# Patient Record
Sex: Female | Born: 1965 | Race: White | Hispanic: No | Marital: Married | Smoking: Never smoker
Health system: Southern US, Community
[De-identification: ages and names within clinical notes are randomized; demographics above are authoritative.]

## PROBLEM LIST (undated history)

## (undated) DIAGNOSIS — I10 Essential (primary) hypertension: Secondary | ICD-10-CM

## (undated) DIAGNOSIS — R600 Localized edema: Secondary | ICD-10-CM

## (undated) DIAGNOSIS — F32A Depression, unspecified: Secondary | ICD-10-CM

## (undated) DIAGNOSIS — K219 Gastro-esophageal reflux disease without esophagitis: Secondary | ICD-10-CM

## (undated) DIAGNOSIS — E119 Type 2 diabetes mellitus without complications: Secondary | ICD-10-CM

## (undated) DIAGNOSIS — F329 Major depressive disorder, single episode, unspecified: Secondary | ICD-10-CM

## (undated) DIAGNOSIS — L680 Hirsutism: Secondary | ICD-10-CM

## (undated) DIAGNOSIS — E785 Hyperlipidemia, unspecified: Secondary | ICD-10-CM

---

## 2006-08-10 ENCOUNTER — Ambulatory Visit: Payer: Self-pay

## 2007-08-15 ENCOUNTER — Ambulatory Visit: Payer: Self-pay

## 2009-03-03 ENCOUNTER — Ambulatory Visit: Payer: Self-pay

## 2009-03-06 ENCOUNTER — Ambulatory Visit: Payer: Self-pay

## 2010-02-25 ENCOUNTER — Ambulatory Visit: Payer: Self-pay

## 2011-04-13 ENCOUNTER — Ambulatory Visit: Payer: Self-pay | Admitting: Obstetrics and Gynecology

## 2012-04-13 ENCOUNTER — Ambulatory Visit: Payer: Self-pay | Admitting: Obstetrics and Gynecology

## 2013-10-05 ENCOUNTER — Ambulatory Visit: Payer: Self-pay | Admitting: Obstetrics and Gynecology

## 2013-10-12 ENCOUNTER — Ambulatory Visit: Payer: Self-pay | Admitting: Obstetrics and Gynecology

## 2015-10-13 ENCOUNTER — Other Ambulatory Visit: Payer: Self-pay | Admitting: Obstetrics and Gynecology

## 2015-10-13 DIAGNOSIS — Z1231 Encounter for screening mammogram for malignant neoplasm of breast: Secondary | ICD-10-CM

## 2015-11-11 ENCOUNTER — Other Ambulatory Visit: Payer: Self-pay | Admitting: Obstetrics and Gynecology

## 2015-11-11 ENCOUNTER — Ambulatory Visit
Admission: RE | Admit: 2015-11-11 | Discharge: 2015-11-11 | Disposition: A | Discharge status: Home / Self Care | Payer: Managed Care, Other (non HMO) | Source: Ambulatory Visit | Attending: Obstetrics and Gynecology | Admitting: Obstetrics and Gynecology

## 2015-11-11 DIAGNOSIS — Z1231 Encounter for screening mammogram for malignant neoplasm of breast: Secondary | ICD-10-CM | POA: Diagnosis not present

## 2015-11-11 DIAGNOSIS — R928 Other abnormal and inconclusive findings on diagnostic imaging of breast: Secondary | ICD-10-CM | POA: Insufficient documentation

## 2015-11-11 DIAGNOSIS — R921 Mammographic calcification found on diagnostic imaging of breast: Secondary | ICD-10-CM

## 2015-11-20 ENCOUNTER — Other Ambulatory Visit: Payer: Self-pay | Admitting: Obstetrics and Gynecology

## 2015-11-20 ENCOUNTER — Ambulatory Visit
Admission: RE | Admit: 2015-11-20 | Discharge: 2015-11-20 | Disposition: A | Discharge status: Home / Self Care | Payer: Managed Care, Other (non HMO) | Source: Ambulatory Visit | Attending: Obstetrics and Gynecology | Admitting: Obstetrics and Gynecology

## 2015-11-20 DIAGNOSIS — R921 Mammographic calcification found on diagnostic imaging of breast: Secondary | ICD-10-CM

## 2015-11-20 DIAGNOSIS — R928 Other abnormal and inconclusive findings on diagnostic imaging of breast: Secondary | ICD-10-CM | POA: Diagnosis present

## 2015-11-26 ENCOUNTER — Ambulatory Visit
Admission: RE | Admit: 2015-11-26 | Discharge: 2015-11-26 | Disposition: A | Discharge status: Home / Self Care | Payer: Managed Care, Other (non HMO) | Source: Ambulatory Visit | Attending: Obstetrics and Gynecology | Admitting: Obstetrics and Gynecology

## 2015-11-26 DIAGNOSIS — R928 Other abnormal and inconclusive findings on diagnostic imaging of breast: Secondary | ICD-10-CM | POA: Insufficient documentation

## 2015-11-26 DIAGNOSIS — R921 Mammographic calcification found on diagnostic imaging of breast: Secondary | ICD-10-CM | POA: Diagnosis not present

## 2015-11-26 HISTORY — PX: BREAST BIOPSY: SHX20

## 2015-11-27 LAB — SURGICAL PATHOLOGY

## 2016-01-07 ENCOUNTER — Other Ambulatory Visit: Payer: Self-pay | Admitting: Surgery

## 2016-01-07 DIAGNOSIS — N6099 Unspecified benign mammary dysplasia of unspecified breast: Secondary | ICD-10-CM

## 2016-01-19 ENCOUNTER — Encounter
Admission: RE | Admit: 2016-01-19 | Discharge: 2016-01-19 | Disposition: A | Discharge status: Home / Self Care | Payer: Managed Care, Other (non HMO) | Source: Ambulatory Visit | Attending: Surgery | Admitting: Surgery

## 2016-01-19 HISTORY — DX: Gastro-esophageal reflux disease without esophagitis: K21.9

## 2016-01-19 HISTORY — DX: Major depressive disorder, single episode, unspecified: F32.9

## 2016-01-19 HISTORY — DX: Depression, unspecified: F32.A

## 2016-01-19 NOTE — Patient Instructions (Signed)
  Your procedure is scheduled on: 01-27-16 [facility] 9 AM PER PT   Remember: Instructions that are not followed completely may result in serious medical risk, up to and including death, or upon the discretion of your surgeon and anesthesiologist your surgery may need to be rescheduled.    _x___ 1. Do not eat food or drink liquids after midnight. No gum chewing or hard candies.     __x__ 2. No Alcohol for 24 hours before or after surgery.   __x__3. No Smoking for 24 prior to surgery.   ____  4. Bring all medications with you on the day of surgery if instructed.    __x__ 5. Notify your doctor if there is any change in your medical condition     (cold, fever, infections).     Do not wear jewelry, make-up, hairpins, clips or nail polish.  Do not wear lotions, powders, or perfumes. You may wear deodorant.  Do not shave 48 hours prior to surgery. Men may shave face and neck.  Do not bring valuables to the hospital.    [facility] is not responsible for any belongings or valuables.               Contacts, dentures or bridgework may not be worn into surgery.  Leave your suitcase in the car. After surgery it may be brought to your room.  For patients admitted to the hospital, discharge time is determined by your treatment team.   Patients discharged the day of surgery will not be allowed to drive home.  You will need someone to drive you home and stay with you the night of your procedure.    Please read over the following fact sheets that you were given:   [facility] Preparing for Surgery and or MRSA Information   ____ Take these medicines the morning of surgery with A SIP OF WATER:    1. NONE  2.  3.  4.  5.  6.  ____Fleets enema or Magnesium Citrate as directed.   ____ Use CHG Soap or sage wipes as directed on instruction sheet   ____ Use inhalers on the day of surgery and bring to hospital day of surgery  ____ Stop metformin 2 days prior to  surgery    ____ Take 1/2 of usual insulin dose the night before surgery and none on the morning of  surgery.   ____ Stop Aspirin, Coumadin, Pllavix ,Eliquis, Effient, or Pradaxa  x__ Stop Anti-inflammatories such as Advil, Aleve, Ibuprofen, Motrin, Naproxen,          Naprosyn, Goodies powders or aspirin products NOW-Ok to take Tylenol.   ____ Stop supplements until after surgery.    ____ Bring C-Pap to the hospital.

## 2016-01-26 ENCOUNTER — Encounter: Payer: Self-pay | Admitting: *Deleted

## 2016-01-27 ENCOUNTER — Ambulatory Visit: Payer: Managed Care, Other (non HMO) | Admitting: Anesthesiology

## 2016-01-27 ENCOUNTER — Ambulatory Visit
Admission: RE | Admit: 2016-01-27 | Discharge: 2016-01-27 | Disposition: A | Discharge status: Home / Self Care | Payer: Managed Care, Other (non HMO) | Source: Ambulatory Visit | Attending: Surgery | Admitting: Surgery

## 2016-01-27 ENCOUNTER — Encounter: Payer: Self-pay | Admitting: *Deleted

## 2016-01-27 ENCOUNTER — Encounter
Admission: RE | Disposition: A | Discharge status: Home / Self Care | Payer: Self-pay | Source: Ambulatory Visit | Attending: Surgery

## 2016-01-27 DIAGNOSIS — K219 Gastro-esophageal reflux disease without esophagitis: Secondary | ICD-10-CM | POA: Insufficient documentation

## 2016-01-27 DIAGNOSIS — F329 Major depressive disorder, single episode, unspecified: Secondary | ICD-10-CM | POA: Diagnosis not present

## 2016-01-27 DIAGNOSIS — N6011 Diffuse cystic mastopathy of right breast: Secondary | ICD-10-CM | POA: Diagnosis not present

## 2016-01-27 DIAGNOSIS — N6021 Fibroadenosis of right breast: Secondary | ICD-10-CM | POA: Insufficient documentation

## 2016-01-27 DIAGNOSIS — N6091 Unspecified benign mammary dysplasia of right breast: Secondary | ICD-10-CM | POA: Diagnosis present

## 2016-01-27 DIAGNOSIS — Z91018 Allergy to other foods: Secondary | ICD-10-CM | POA: Insufficient documentation

## 2016-01-27 DIAGNOSIS — Z803 Family history of malignant neoplasm of breast: Secondary | ICD-10-CM | POA: Insufficient documentation

## 2016-01-27 DIAGNOSIS — I10 Essential (primary) hypertension: Secondary | ICD-10-CM | POA: Insufficient documentation

## 2016-01-27 DIAGNOSIS — N6099 Unspecified benign mammary dysplasia of unspecified breast: Secondary | ICD-10-CM

## 2016-01-27 DIAGNOSIS — N6081 Other benign mammary dysplasias of right breast: Secondary | ICD-10-CM | POA: Insufficient documentation

## 2016-01-27 HISTORY — DX: Localized edema: R60.0

## 2016-01-27 HISTORY — PX: BREAST LUMPECTOMY WITH NEEDLE LOCALIZATION: SHX5759

## 2016-01-27 HISTORY — PX: BREAST EXCISIONAL BIOPSY: SUR124

## 2016-01-27 LAB — POCT PREGNANCY, URINE: Preg Test, Ur: NEGATIVE

## 2016-01-27 SURGERY — BREAST LUMPECTOMY WITH NEEDLE LOCALIZATION
Anesthesia: General | Laterality: Right | Wound class: Clean

## 2016-01-27 MED ORDER — LACTATED RINGERS IV SOLN
INTRAVENOUS | Status: DC
  Administered 2016-01-27 (×2): via INTRAVENOUS

## 2016-01-27 MED ORDER — EPINEPHRINE PF 1 MG/ML IJ SOLN
INTRAMUSCULAR | Status: AC
  Filled 2016-01-27: qty 1

## 2016-01-27 MED ORDER — LIDOCAINE HCL (CARDIAC) 20 MG/ML IV SOLN
INTRAVENOUS | Status: DC | PRN
  Administered 2016-01-27: 100 mg via INTRAVENOUS

## 2016-01-27 MED ORDER — HYDROCODONE-ACETAMINOPHEN 5-325 MG PO TABS: 1.0000 | ORAL_TABLET | ORAL | 0 refills | Status: DC | PRN

## 2016-01-27 MED ORDER — FENTANYL CITRATE (PF) 100 MCG/2ML IJ SOLN
INTRAMUSCULAR | Status: DC | PRN
  Administered 2016-01-27 (×3): 50 ug via INTRAVENOUS

## 2016-01-27 MED ORDER — PHENYLEPHRINE HCL 10 MG/ML IJ SOLN
INTRAMUSCULAR | Status: DC | PRN
  Administered 2016-01-27: 100 ug via INTRAVENOUS

## 2016-01-27 MED ORDER — FENTANYL CITRATE (PF) 100 MCG/2ML IJ SOLN: 25.0000 ug | INTRAMUSCULAR | Status: DC | PRN

## 2016-01-27 MED ORDER — HYDROCODONE-ACETAMINOPHEN 5-325 MG PO TABS: 1.0000 | ORAL_TABLET | ORAL | Status: DC | PRN

## 2016-01-27 MED ORDER — MIDAZOLAM HCL 2 MG/2ML IJ SOLN
INTRAMUSCULAR | Status: DC | PRN
  Administered 2016-01-27: 2 mg via INTRAVENOUS

## 2016-01-27 MED ORDER — DEXAMETHASONE SODIUM PHOSPHATE 10 MG/ML IJ SOLN
INTRAMUSCULAR | Status: DC | PRN
  Administered 2016-01-27: 10 mg via INTRAVENOUS

## 2016-01-27 MED ORDER — BUPIVACAINE-EPINEPHRINE 0.5% -1:200000 IJ SOLN
INTRAMUSCULAR | Status: DC | PRN
  Administered 2016-01-27: 8 mL

## 2016-01-27 MED ORDER — BUPIVACAINE HCL (PF) 0.5 % IJ SOLN
INTRAMUSCULAR | Status: AC
  Filled 2016-01-27: qty 30

## 2016-01-27 MED ORDER — ONDANSETRON HCL 4 MG/2ML IJ SOLN: 4.0000 mg | Freq: Once | INTRAMUSCULAR | Status: DC | PRN

## 2016-01-27 MED ORDER — FAMOTIDINE 20 MG PO TABS
ORAL_TABLET | ORAL | Status: AC
  Filled 2016-01-27: qty 1

## 2016-01-27 MED ORDER — FAMOTIDINE 20 MG PO TABS
20.0000 mg | ORAL_TABLET | Freq: Once | ORAL | Status: AC
  Administered 2016-01-27: 20 mg via ORAL

## 2016-01-27 MED ORDER — ACETAMINOPHEN 325 MG PO TABS
650.0000 mg | ORAL_TABLET | Freq: Once | ORAL | Status: AC
  Administered 2016-01-27: 650 mg via ORAL

## 2016-01-27 MED ORDER — ACETAMINOPHEN 325 MG PO TABS
ORAL_TABLET | ORAL | Status: AC
  Filled 2016-01-27: qty 2

## 2016-01-27 MED ORDER — PROPOFOL 10 MG/ML IV BOLUS
INTRAVENOUS | Status: DC | PRN
  Administered 2016-01-27: 170 mg via INTRAVENOUS

## 2016-01-27 MED ORDER — ONDANSETRON HCL 4 MG/2ML IJ SOLN
INTRAMUSCULAR | Status: DC | PRN
  Administered 2016-01-27: 4 mg via INTRAVENOUS

## 2016-01-27 SURGICAL SUPPLY — 28 items
BLADE SURG 15 STRL LF DISP TIS (BLADE) IMPLANT
BLADE SURG 15 STRL SS (BLADE)
CANISTER SUCT 1200ML W/VALVE (MISCELLANEOUS) ×3 IMPLANT
CHLORAPREP W/TINT 26ML (MISCELLANEOUS) ×3 IMPLANT
DERMABOND ADVANCED (GAUZE/BANDAGES/DRESSINGS) ×2
DERMABOND ADVANCED .7 DNX12 (GAUZE/BANDAGES/DRESSINGS) ×1 IMPLANT
DEVICE DUBIN SPECIMEN MAMMOGRA (MISCELLANEOUS) ×3 IMPLANT
DRAPE LAPAROTOMY 77X122 PED (DRAPES) ×3 IMPLANT
ELECT REM PT RETURN 9FT ADLT (ELECTROSURGICAL) ×3
ELECTRODE REM PT RTRN 9FT ADLT (ELECTROSURGICAL) ×1 IMPLANT
GLOVE BIO SURGEON STRL SZ7.5 (GLOVE) ×9 IMPLANT
GLOVE BIOGEL PI IND STRL 7.0 (GLOVE) ×2 IMPLANT
GLOVE BIOGEL PI INDICATOR 7.0 (GLOVE) ×4
GOWN STRL REUS W/ TWL LRG LVL3 (GOWN DISPOSABLE) ×3 IMPLANT
GOWN STRL REUS W/TWL LRG LVL3 (GOWN DISPOSABLE) ×6
KIT RM TURNOVER STRD PROC AR (KITS) ×3 IMPLANT
LABEL OR SOLS (LABEL) ×3 IMPLANT
MARGIN MAP 10MM (MISCELLANEOUS) ×3 IMPLANT
NEEDLE HYPO 25X1 1.5 SAFETY (NEEDLE) ×3 IMPLANT
PACK BASIN MINOR ARMC (MISCELLANEOUS) ×3 IMPLANT
SUT CHROMIC 4 0 RB 1X27 (SUTURE) ×3 IMPLANT
SUT ETHILON 3-0 FS-10 30 BLK (SUTURE) ×3
SUT MNCRL 4-0 (SUTURE) ×2
SUT MNCRL 4-0 27XMFL (SUTURE) ×1
SUTURE EHLN 3-0 FS-10 30 BLK (SUTURE) ×1 IMPLANT
SUTURE MNCRL 4-0 27XMF (SUTURE) ×1 IMPLANT
SYRINGE 10CC LL (SYRINGE) ×3 IMPLANT
WATER STERILE IRR 1000ML POUR (IV SOLUTION) ×3 IMPLANT

## 2016-01-27 NOTE — Anesthesia Postprocedure Evaluation (Signed)
Anesthesia Post Note  Patient: Vale HavenDena P Schuh  Procedure(s) Performed: Procedure(s) (LRB): BREAST LUMPECTOMY WITH NEEDLE LOCALIZATION (Right)  Patient location during evaluation: PACU Anesthesia Type: General Level of consciousness: awake and alert Pain management: pain level controlled Vital Signs Assessment: post-procedure vital signs reviewed and stable Respiratory status: spontaneous breathing and respiratory function stable Cardiovascular status: stable Anesthetic complications: no    Last Vitals:  Vitals:   01/27/16 1203 01/27/16 1212  BP: 133/75   Pulse: 91 92  Resp: 14 11  Temp: 36.2 C     Last Pain:  Vitals:   01/27/16 1203  TempSrc:   PainSc: Asleep                 KEPHART,WILLIAM K

## 2016-01-27 NOTE — Anesthesia Procedure Notes (Signed)
Procedure Name: LMA Insertion Date/Time: 01/27/2016 10:52 AM Performed by: Michaele OfferSAVAGE, Kamari Bilek Pre-anesthesia Checklist: Patient identified, Emergency Drugs available, Suction available, Patient being monitored and Timeout performed Patient Re-evaluated:Patient Re-evaluated prior to inductionOxygen Delivery Method: Circle system utilized Preoxygenation: Pre-oxygenation with 100% oxygen Intubation Type: IV induction Ventilation: Oral airway inserted - appropriate to patient size LMA: LMA inserted LMA Size: 4.0 Number of attempts: 1 Placement Confirmation: positive ETCO2 and breath sounds checked- equal and bilateral Tube secured with: Tape Dental Injury: Teeth and Oropharynx as per pre-operative assessment

## 2016-01-27 NOTE — Discharge Instructions (Addendum)
Take Tylenol or Norco if needed for pain.  Should not drive or do anything dangerous when taking Norco.  May shower.  Wear bra as desired for support.  AMBULATORY SURGERY  DISCHARGE INSTRUCTIONS   1) The drugs that you were given will stay in your system until tomorrow so for the next 24 hours you should not:  A) Drive an automobile B) Make any legal decisions C) Drink any alcoholic beverage   2) You may resume regular meals tomorrow.  Today it is better to start with liquids and gradually work up to solid foods.  You may eat anything you prefer, but it is better to start with liquids, then soup and crackers, and gradually work up to solid foods.   3) Please notify your doctor immediately if you have any unusual bleeding, trouble breathing, redness and pain at the surgery site, drainage, fever, or pain not relieved by medication.    4) Additional Instructions:   Please contact your physician with any problems or Same Day Surgery at [phone], Monday through Friday 6 am to 4 pm, or [facility] Main number at [phone].

## 2016-01-27 NOTE — Anesthesia Preprocedure Evaluation (Signed)
Anesthesia Evaluation  Patient identified by MRN, date of birth, ID band Patient awake    Reviewed: Allergy & Precautions, NPO status , Patient's Chart, lab work & pertinent test results  History of Anesthesia Complications Negative for: history of anesthetic complications  Airway Mallampati: II       Dental   Pulmonary neg pulmonary ROS,           Cardiovascular hypertension, Pt. on medications      Neuro/Psych    GI/Hepatic Neg liver ROS, GERD  ,  Endo/Other  negative endocrine ROS  Renal/GU negative Renal ROS     Musculoskeletal   Abdominal   Peds  Hematology negative hematology ROS (+)   Anesthesia Other Findings   Reproductive/Obstetrics                             Anesthesia Physical Anesthesia Plan  ASA: II  Anesthesia Plan: General   Post-op Pain Management:    Induction: Intravenous  Airway Management Planned: LMA  Additional Equipment:   Intra-op Plan:   Post-operative Plan:   Informed Consent: I have reviewed the patients History and Physical, chart, labs and discussed the procedure including the risks, benefits and alternatives for the proposed anesthesia with the patient or authorized representative who has indicated his/her understanding and acceptance.     Plan Discussed with:   Anesthesia Plan Comments:         Anesthesia Quick Evaluation

## 2016-01-27 NOTE — Op Note (Signed)
OPERATIVE REPORT  PREOPERATIVE  DIAGNOSIS: . Atypical lobular hyperplasia of the right breast   POSTOPERATIVE DIAGNOSIS: . Atypical lobular hyperplasia of the right breast  PROCEDURE: . Excision right breast mass  ANESTHESIA:  General  SURGEON: Renda RollsWilton Analiya Porco  MD   INDICATIONS: . She had recent mammogram depicting a history of calcifications in the lateral aspect of the right breast. Stereotactic needle biopsy demonstrated atypical ductal hyperplasia and excision was recommended for further evaluation and treatment. She did have preoperative insertion of Kopan's wire. I have reviewed the mammogram images with wire in place and also see the biopsy marker.  With the patient on the operating table in the supine position under general anesthesia the dressing was removed from the right breast. The Kopan's wire was cut 2 cm from the skin. The breast was prepared with ChloraPrep and draped in a sterile manner. A curvilinear incision was made in the right breast from the 8:00 position to the 10:00 position  4.5 cm from the nipple. Dissection was carried down through subcutaneous tissues. Several small bleeding points were cauterized. The Kopan's wire was encountered. A portion of tissue surrounding the tip of the wire was removed which was approximately 3 x 3 x 2 cm in dimension. This was oriented with the margin maps attaching markers to the medial lateral cranial caudal superficial and deep margins. The specimen was submitted for specimen mammogram and routine pathology. The radiologist did call to report that the biopsy marker was seen in the specimen mammogram.  The wound was inspected and several small bleeding points were cauterized. Subcutaneous tissues were infiltrated with half percent Sensorcaine with epinephrine. Deeper tissues were infiltrated as well. Subcutaneous tissues  were closed with interrupted 4-0 chromic. The skin was closed with running 4-0 Monocryl subcuticular suture and  Dermabond.  The patient appeared to tolerate the procedure satisfactorily and was then prepared for transfer to the recovery room  [facility] Elford Evilsizer M.D.

## 2016-01-27 NOTE — H&P (Signed)
Monica Mack is an 50 y.o. female.   Chief Complaint: Right breast mass HPI: She had a recent screening mammograms on 11/11/2015. In the right breast calcifications were identified. She had additional views on 11/20/2015 with findings of a 6 mm group of pleomorphic calcifications upper outer quadrant of right breast. There was no associated mass identified. She later had stereotactic biopsy. Pathology demonstrated atypical lobular hyperplasia with microcalcifications. Excision was recommended for further evaluation and treatment.     Past Medical History:  Diagnosis Date  . Depression   . GERD (gastroesophageal reflux disease)    TUMS PRN  . Lower leg edema   She did have an episode of swelling of her uvula last week after eating some knots. She visited a walk-in clinic and was given a taper course of prednisone and symptoms now resolved.  Past Surgical History:  Procedure Laterality Date  . BREAST BIOPSY Right 11/26/2015   atypical lobular hyperplasia-lumpectomy 01/27/2016    Family History  Problem Relation Age of Onset  . Breast cancer Mother [id number]  . Breast cancer Maternal Aunt 6469  . Breast cancer Maternal Grandmother [id number]   Social History:  reports that she has never smoked. She has never used smokeless tobacco. She reports that she drinks alcohol. She reports that she does not use drugs.  Allergies:  Allergies  Allergen Reactions  . Other     CASHEWS- UVULA SWELLING    Medications Prior to Admission  Medication Sig Dispense Refill  . calcium carbonate (TUMS - DOSED IN MG ELEMENTAL CALCIUM) 500 MG chewable tablet Chew 1 tablet by mouth as needed for indigestion or heartburn.    . hydrochlorothiazide (HYDRODIURIL) 25 MG tablet Take 25 mg by mouth daily as needed. For fluid retention.    Marland Kitchen. ibuprofen (ADVIL,MOTRIN) 200 MG tablet Take 200-400 mg by mouth every 8 (eight) hours as needed (for pain.).    Marland Kitchen. predniSONE (DELTASONE) 10 MG tablet Take 10 mg by mouth daily with breakfast. 6  DAY PREDNISONE TAPER      No results found for this or any previous visit (from the past 48 hour(s)). No results found.  ROS she reports no other recent acute illness such as cold or sore throat. She reports no difficulties breathing. She does occasionally have some ankle edema for which she takes hydrochlorothiazide. Review of systems otherwise negative  Last menstrual period 01/17/2016. Physical Exam  GENERAL:  Awake alert and oriented and in no acute distress.  HEENT:  Head is normocephalic.  Pupils are equal reactive to light.  Extraocular movements are intact. Sclera is clear.  Pharynx is clear. The uvula appears to be normal size  NECK:  Supple with no palpable mass and no adenopathy.  LUNGS:  Clear without rales rhonchi or wheezes.  HEART:  Regular rhythm S1-S2, without murmur.  BREASTS: There is a dressing on the right breast. The right breast was marked YES  EXTREMITIES: There is no current deep and edema.  NEUROLOGIC: She is awake alert and oriented and moving all extremities    Assessment/Plan Atypical ductal hyperplasia of the right breast  A discussed the plan for excision. The Kopan's is in place and mammogram images have been reviewed  Renda RollsWilton Smith, MD 01/27/2016, 10:00 AM

## 2016-01-27 NOTE — Transfer of Care (Signed)
Immediate Anesthesia Transfer of Care Note  Patient: Monica Mack  Procedure(s) Performed: Procedure(s): BREAST LUMPECTOMY WITH NEEDLE LOCALIZATION (Right)  Patient Location: PACU  Anesthesia Type:General  Level of Consciousness: awake, oriented and patient cooperative  Airway & Oxygen Therapy: Patient Spontanous Breathing and Patient connected to face mask oxygen  Post-op Assessment: Report given to RN, Post -op Vital signs reviewed and stable and Patient moving all extremities X 4  Post vital signs: Reviewed and stable  Last Vitals:  Vitals:   01/27/16 1003  BP: (!) 187/91  Pulse: 99  Resp: 18  Temp: 36.5 C    Last Pain:  Vitals:   01/27/16 1003  TempSrc: Oral         Complications: No apparent anesthesia complications

## 2016-01-27 NOTE — OR Nursing (Signed)
Dr. Katrinka BlazingSmith visited and OK'd dc home

## 2016-01-29 LAB — SURGICAL PATHOLOGY

## 2017-11-02 ENCOUNTER — Other Ambulatory Visit: Payer: Self-pay | Admitting: Obstetrics and Gynecology

## 2017-11-02 DIAGNOSIS — Z1231 Encounter for screening mammogram for malignant neoplasm of breast: Secondary | ICD-10-CM

## 2017-11-17 ENCOUNTER — Ambulatory Visit
Admission: RE | Admit: 2017-11-17 | Discharge: 2017-11-17 | Disposition: A | Discharge status: Home / Self Care | Payer: 59 | Source: Ambulatory Visit | Attending: Obstetrics and Gynecology | Admitting: Obstetrics and Gynecology

## 2017-11-17 DIAGNOSIS — Z1231 Encounter for screening mammogram for malignant neoplasm of breast: Secondary | ICD-10-CM | POA: Insufficient documentation

## 2018-12-20 ENCOUNTER — Other Ambulatory Visit: Payer: Self-pay | Admitting: Obstetrics and Gynecology

## 2018-12-20 DIAGNOSIS — Z1231 Encounter for screening mammogram for malignant neoplasm of breast: Secondary | ICD-10-CM

## 2019-03-19 ENCOUNTER — Ambulatory Visit
Admission: RE | Admit: 2019-03-19 | Discharge: 2019-03-19 | Disposition: A | Discharge status: Home / Self Care | Payer: Managed Care, Other (non HMO) | Source: Ambulatory Visit | Attending: Obstetrics and Gynecology | Admitting: Obstetrics and Gynecology

## 2019-03-19 DIAGNOSIS — Z1231 Encounter for screening mammogram for malignant neoplasm of breast: Secondary | ICD-10-CM | POA: Diagnosis not present

## 2020-04-05 IMAGING — MG DIGITAL SCREENING BILAT W/ TOMO W/ CAD
6 of 10 series · 6 of 30 positions shown · non-contrast
Comparison: Previous exam(s).

CLINICAL DATA: Screening.

EXAM:
DIGITAL SCREENING BILATERAL MAMMOGRAM WITH TOMO AND CAD

[L MLO synth-2D]
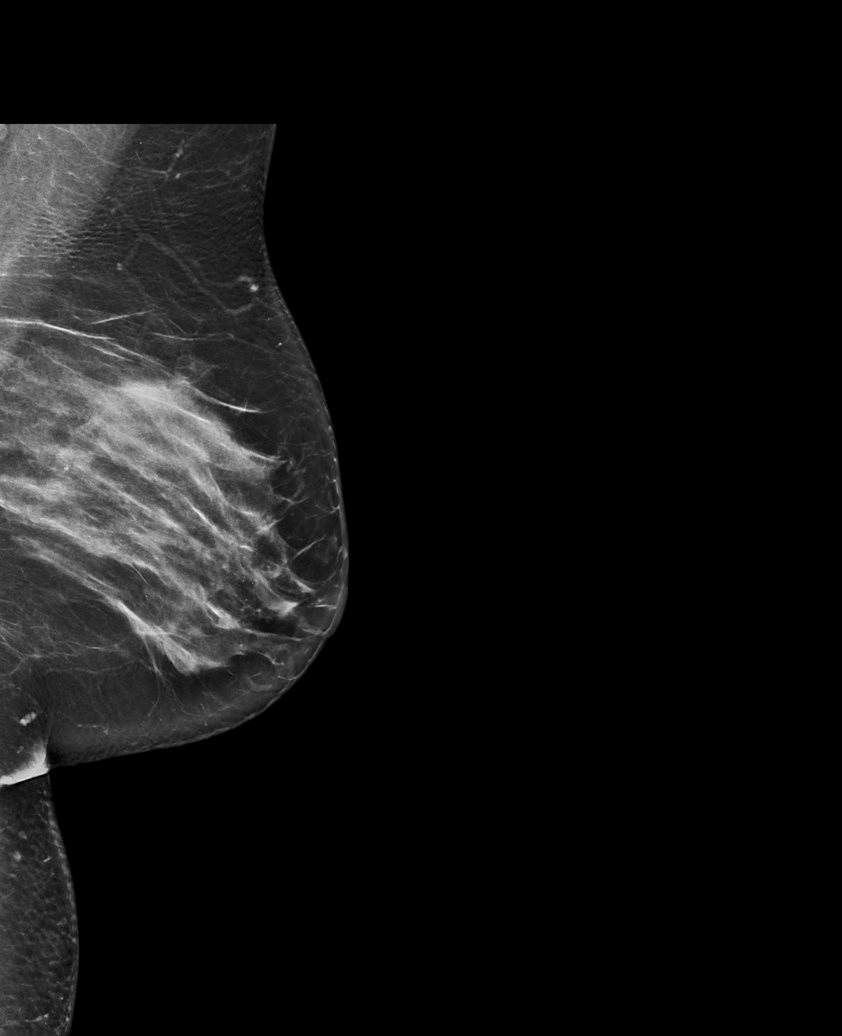

[L CC synth-2D]
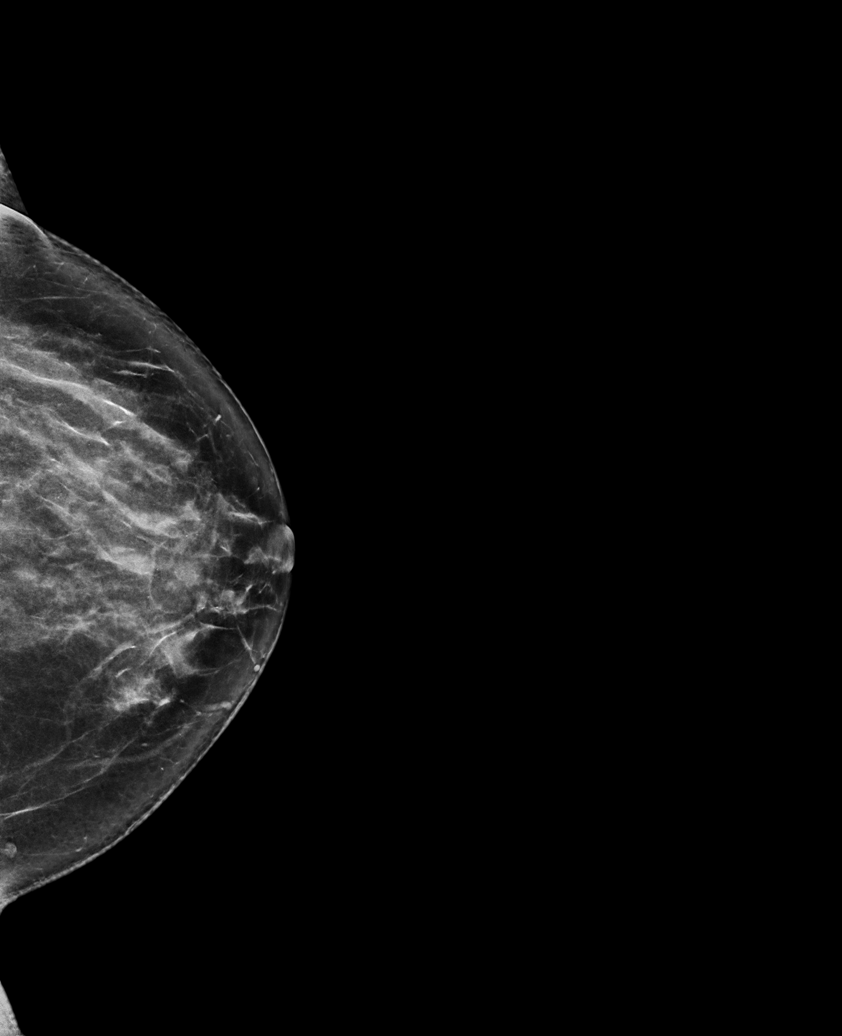

[R CC synth-2D]
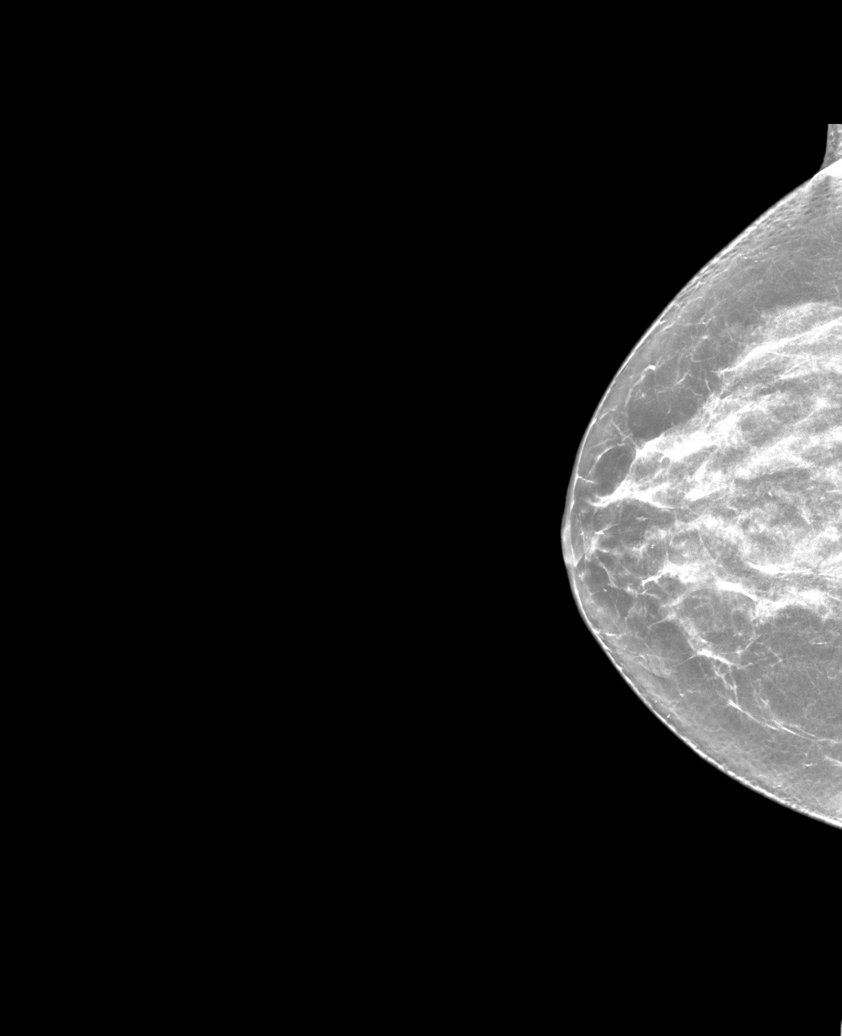

[R MLO synth-2D (1 of 2)]
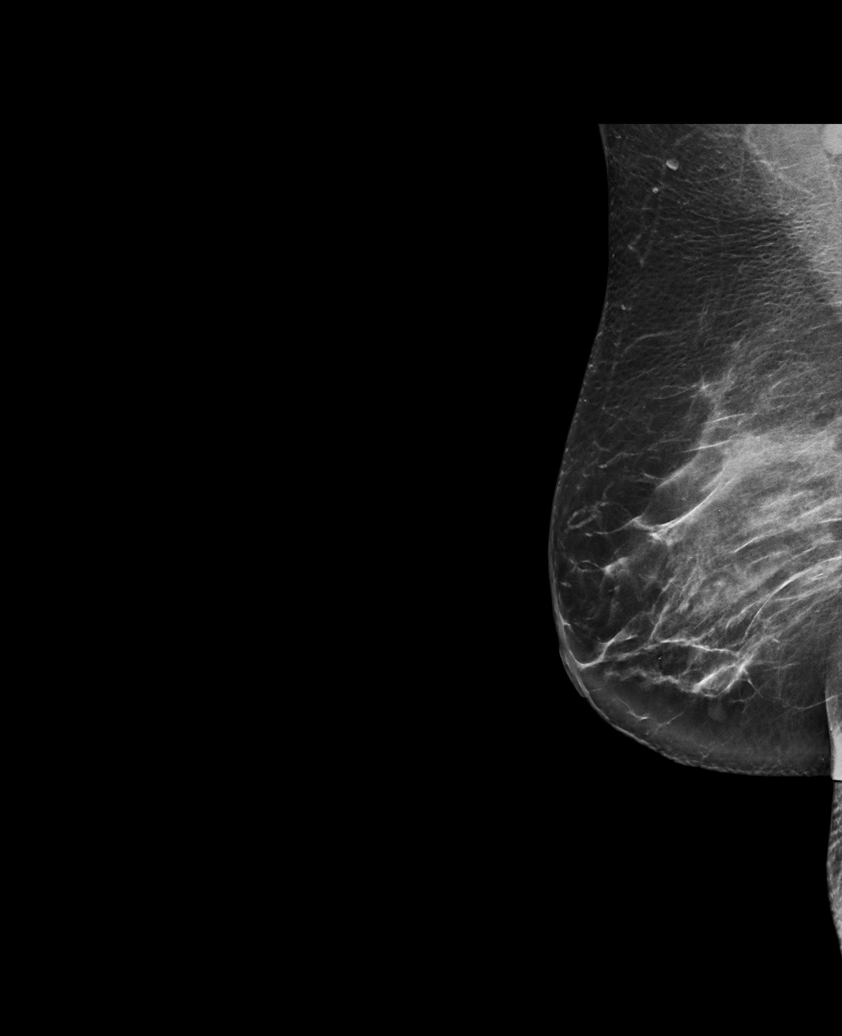

[R MLO synth-2D (2 of 2)]
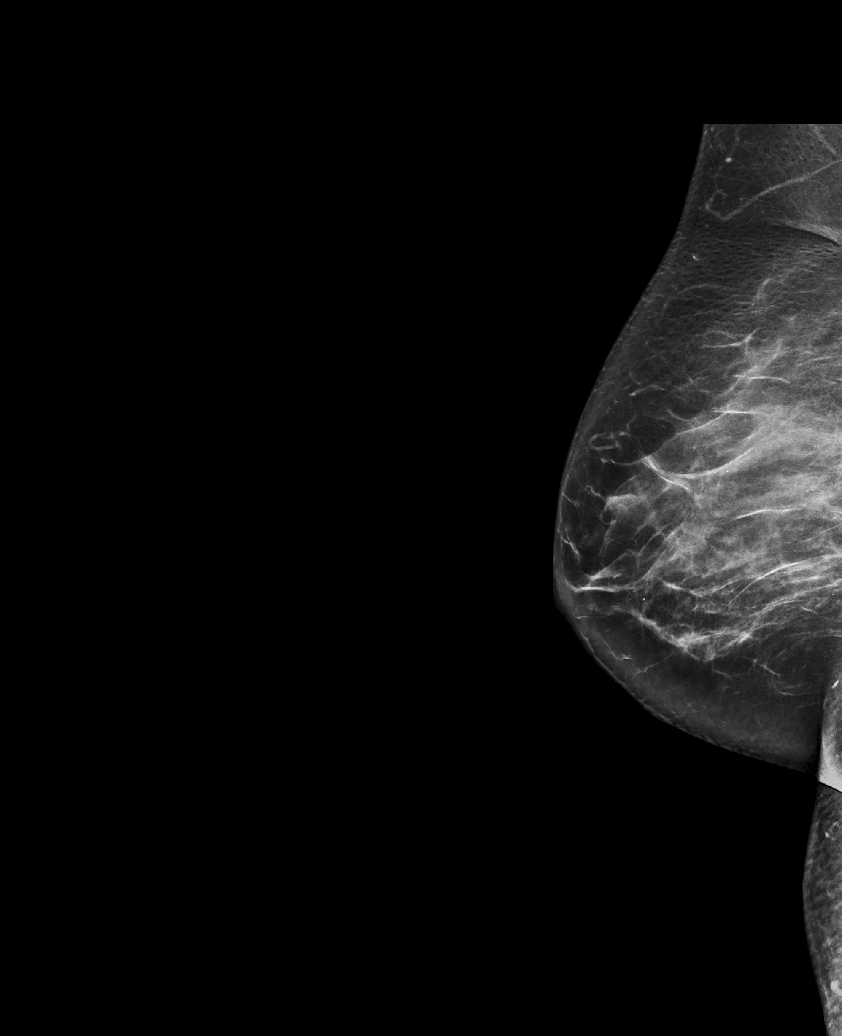

[R MLO tomo · tomo slice 39/77.0]
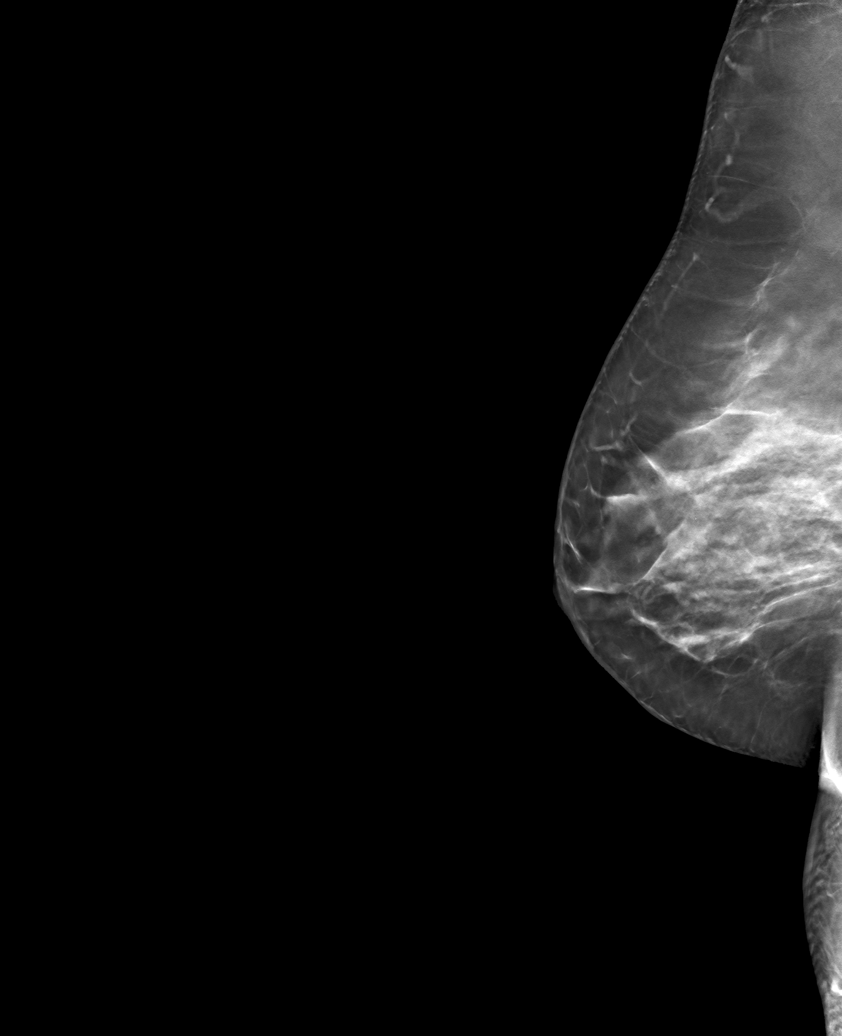

[6 of 30 positions shown; findings below may reference images not displayed]

ACR Breast Density Category c: The breast tissue is heterogeneously
dense, which may obscure small masses.
FINDINGS: There are no findings suspicious for malignancy. Images were
processed with CAD.
IMPRESSION: No mammographic evidence of malignancy. A result letter of this
screening mammogram will be mailed directly to the patient.

RECOMMENDATION:
Screening mammogram in one year. (Code:FT-U-LHB)

BI-RADS CATEGORY  1: Negative.

## 2022-04-12 ENCOUNTER — Other Ambulatory Visit: Payer: Self-pay | Admitting: Internal Medicine

## 2022-04-12 DIAGNOSIS — Z1231 Encounter for screening mammogram for malignant neoplasm of breast: Secondary | ICD-10-CM

## 2022-04-29 ENCOUNTER — Ambulatory Visit
Admission: RE | Admit: 2022-04-29 | Discharge: 2022-04-29 | Disposition: A | Discharge status: Home / Self Care | Payer: Managed Care, Other (non HMO) | Source: Ambulatory Visit | Attending: Internal Medicine | Admitting: Internal Medicine

## 2022-04-29 DIAGNOSIS — Z1231 Encounter for screening mammogram for malignant neoplasm of breast: Secondary | ICD-10-CM | POA: Diagnosis present

## 2022-05-04 ENCOUNTER — Other Ambulatory Visit: Payer: Self-pay | Admitting: Internal Medicine

## 2022-05-04 DIAGNOSIS — R928 Other abnormal and inconclusive findings on diagnostic imaging of breast: Secondary | ICD-10-CM

## 2022-05-04 DIAGNOSIS — R921 Mammographic calcification found on diagnostic imaging of breast: Secondary | ICD-10-CM

## 2022-05-05 ENCOUNTER — Ambulatory Visit
Admission: RE | Admit: 2022-05-05 | Discharge: 2022-05-05 | Disposition: A | Discharge status: Home / Self Care | Payer: Managed Care, Other (non HMO) | Source: Ambulatory Visit | Attending: Internal Medicine | Admitting: Internal Medicine

## 2022-05-05 DIAGNOSIS — R921 Mammographic calcification found on diagnostic imaging of breast: Secondary | ICD-10-CM | POA: Insufficient documentation

## 2022-05-05 DIAGNOSIS — R928 Other abnormal and inconclusive findings on diagnostic imaging of breast: Secondary | ICD-10-CM

## 2022-05-07 ENCOUNTER — Other Ambulatory Visit: Payer: Self-pay | Admitting: Internal Medicine

## 2022-05-07 DIAGNOSIS — R928 Other abnormal and inconclusive findings on diagnostic imaging of breast: Secondary | ICD-10-CM

## 2022-05-07 DIAGNOSIS — R921 Mammographic calcification found on diagnostic imaging of breast: Secondary | ICD-10-CM

## 2022-05-11 ENCOUNTER — Ambulatory Visit
Admission: RE | Admit: 2022-05-11 | Discharge: 2022-05-11 | Discharge status: Home / Self Care | Payer: Managed Care, Other (non HMO) | Source: Ambulatory Visit | Attending: Internal Medicine | Admitting: Internal Medicine

## 2022-05-11 ENCOUNTER — Ambulatory Visit
Admission: RE | Admit: 2022-05-11 | Discharge: 2022-05-11 | Disposition: A | Discharge status: Home / Self Care | Payer: Managed Care, Other (non HMO) | Source: Ambulatory Visit | Attending: Internal Medicine | Admitting: Internal Medicine

## 2022-05-11 DIAGNOSIS — R921 Mammographic calcification found on diagnostic imaging of breast: Secondary | ICD-10-CM

## 2022-05-11 DIAGNOSIS — R928 Other abnormal and inconclusive findings on diagnostic imaging of breast: Secondary | ICD-10-CM

## 2022-05-11 HISTORY — PX: BREAST BIOPSY: SHX20

## 2022-05-11 MED ORDER — LIDOCAINE-EPINEPHRINE 1 %-1:100000 IJ SOLN
10.0000 mL | Freq: Once | INTRAMUSCULAR | Status: AC
  Administered 2022-05-11: 10 mL
  Filled 2022-05-11: qty 10

## 2022-05-11 MED ORDER — LIDOCAINE HCL (PF) 1 % IJ SOLN
5.0000 mL | Freq: Once | INTRAMUSCULAR | Status: AC
  Administered 2022-05-11: 5 mL
  Filled 2022-05-11: qty 5

## 2022-05-11 MED ORDER — LIDOCAINE HCL (PF) 1 % IJ SOLN
15.0000 mL | Freq: Once | INTRAMUSCULAR | Status: AC
  Administered 2022-05-11: 5 mL
  Filled 2022-05-11: qty 15

## 2022-05-12 LAB — SURGICAL PATHOLOGY

## 2022-05-13 ENCOUNTER — Encounter: Payer: Self-pay | Admitting: *Deleted

## 2022-05-13 NOTE — Progress Notes (Signed)
Referral recieved from [facility] for benign breast mass.  Appointment scheduled for surgical consultation with Dr. Peyton Najjar on 4/9.

## 2022-05-27 ENCOUNTER — Ambulatory Visit: Payer: Managed Care, Other (non HMO)

## 2022-05-27 DIAGNOSIS — D122 Benign neoplasm of ascending colon: Secondary | ICD-10-CM | POA: Diagnosis not present

## 2022-05-27 DIAGNOSIS — D123 Benign neoplasm of transverse colon: Secondary | ICD-10-CM | POA: Diagnosis not present

## 2022-05-27 DIAGNOSIS — K640 First degree hemorrhoids: Secondary | ICD-10-CM | POA: Diagnosis not present

## 2022-05-27 DIAGNOSIS — Z1211 Encounter for screening for malignant neoplasm of colon: Secondary | ICD-10-CM | POA: Diagnosis present

## 2022-05-27 DIAGNOSIS — K5730 Diverticulosis of large intestine without perforation or abscess without bleeding: Secondary | ICD-10-CM | POA: Diagnosis not present

## 2022-06-01 ENCOUNTER — Ambulatory Visit: Payer: Self-pay | Admitting: General Surgery

## 2022-06-01 NOTE — H&P (View-Only) (Signed)
PATIENT PROFILE: Monica Mack is a 57 y.o. female who presents to the Clinic for consultation at the request of Monica Mack for evaluation of right breast atypical ductal hyperplasia..  PCP:  Monica Mack  HISTORY OF PRESENT ILLNESS: Monica Mack reports she had her usual screening mammogram on 04/29/2022.  She was found with possible distortion of both breast.  This led to diagnostic mammogram.  Diagnostic mammogram confirmed that there was innumerable pleomorphic microcalcifications throughout the breast predominantly in the upper inner quadrant and upper outer quadrant.  These measure a span of a time 7 cm.  On the right breast there is an intermediate group of calcifications in the right upper outer quadrant.  This measured 2 cm.  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  The 2 main area of calcification on the left breast will also biopsied showing lobular neoplasia.  Important to note patient had atypical lobar hyperplasia with microcalcifications in the right breast.  She had excisional biopsy confirming atypical lobar hyperplasia.  As per evaluation of note from Monica Mack from 12/10/2015 and 02/09/2016 there was recommendation to get evaluation by medical oncology for discussion of risk reducing measure due to her high risk of breast cancer.  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   Overview    Post lumpectomy 2018       B12 deficiency 10/18/2013   Overview     237, 9/19       PCOS (polycystic ovarian syndrome) 06/22/2013    GENERAL REVIEW OF SYSTEMS:   General ROS: negative for - chills, fatigue, fever, weight gain or weight loss Allergy and Immunology ROS: negative for - hives  Hematological and Lymphatic ROS: negative for - bleeding problems or bruising, negative for palpable nodes Endocrine ROS: negative for - heat or cold intolerance, hair changes Respiratory ROS: negative for - cough, shortness of breath or wheezing Cardiovascular ROS: no chest pain or palpitations GI ROS: negative for nausea, vomiting, abdominal pain, diarrhea, constipation Musculoskeletal ROS: negative for - joint swelling or muscle pain Neurological ROS: negative for - confusion, syncope Dermatological ROS: negative for pruritus and rash Psychiatric: negative for anxiety, depression, difficulty sleeping and memory loss  MEDICATIONS: Current Outpatient Medications  Medication Sig Dispense Refill   amLODIPine (NORVASC) 10 MG tablet Take 1 tablet (10 mg total) by mouth at bedtime 90 tablet 3   empagliflozin (JARDIANCE) 10 mg tablet Take 1 tablet (10 mg total) by mouth once daily 90 tablet 3   ibuprofen (IBU ORAL) Take by mouth     losartan (COZAAR) 50 MG tablet Take 1 tablet (50 mg total) by mouth once daily 90 tablet 3   metFORMIN (GLUCOPHAGE) 500 MG tablet Take 1 tablet (500 mg total) by mouth once daily 90 tablet 3   multivitamin tablet Take 2 tablets by mouth once daily +IRON     phentermine (ADIPEX-P) 30 MG capsule Take 1 capsule (30 mg total) by   mouth every morning before breakfast 30 capsule 4   rosuvastatin (CRESTOR) 10 MG tablet Take 1 tablet by mouth once daily 90 tablet 0   sodium, potassium, and magnesium (SUPREP) oral solution Take 1 Bottle by mouth as directed One kit contains 2 bottles.  Take both bottles at the times instructed by your provider. 354 mL 0   venlafaxine (EFFEXOR-XR) 37.5 MG XR capsule Take 1 capsule (37.5 mg total) by mouth once daily  (Patient not taking: Reported on 05/18/2022) 90 capsule 3   No current facility-administered medications for this visit.    ALLERGIES: Patient has no known allergies.  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   Post lumpectomy 2018   Type 2 diabetes mellitus without complication (CMS/HHS-HCC) 05/20/2020    PAST SURGICAL HISTORY: Past Surgical History:  Procedure Laterality Date   Excision breast mass Right 2017   Atypical lobular hyperplasia   Colon @ [facility]  05/27/2022   Tubulovillous adenomas/   BREAST BIOPSY     BREAST SURGERY       FAMILY HISTORY: Family History  Problem Relation Age of Onset   Breast cancer Mother    High blood pressure (Hypertension) Mother    No Known Problems Father    Breast cancer Maternal Aunt    Breast cancer Maternal Grandmother    Colon cancer Maternal Grandfather      SOCIAL HISTORY: Social History   Socioeconomic History   Marital status: Married  Tobacco Use   Smoking status: Never   Smokeless tobacco: Never  Vaping Use   Vaping Use: Never used  Substance and Sexual Activity   Alcohol use: No   Drug use: Never   Sexual activity: Not Currently    Partners: Male    Birth control/protection: I.U.D., None  Social History Narrative   Married,.Works at [employer] in [place].    PHYSICAL EXAM: Vitals:   06/01/22 1428  BP: (!) 146/77  Pulse: (!) 111   Body mass index is 36.05 kg/m. Weight: 95.3 kg (210 lb 1.6 oz)   GENERAL: Alert, active, oriented x3  HEENT: Pupils equal reactive to light. Extraocular movements are intact. Sclera clear. Palpebral conjunctiva normal red color.Pharynx clear.  NECK: Supple with no palpable mass and no adenopathy.  LUNGS: Sound clear with no rales rhonchi or wheezes.  HEART: Regular  rhythm S1 and S2 without murmur.  BREAST: breasts appear normal, no suspicious masses, no skin or nipple changes or axillary nodes.  ABDOMEN: Soft and depressible, nontender with no palpable mass, no hepatomegaly.  EXTREMITIES: Well-developed well-nourished symmetrical with no dependent edema.  NEUROLOGICAL: Awake alert oriented, facial expression symmetrical, moving all extremities.  REVIEW OF DATA: I have reviewed the following data today: No visits with results within 3 Month(s) from this visit.  Latest known visit with results is:  Appointment on 01/18/2022  Component Date Value   WBC (White Blood Cell Co* 01/18/2022 9.3    RBC (Red Blood Cell Coun* 01/18/2022 5.97 (H)    Hemoglobin 01/18/2022 15.5 (H)    Hematocrit 01/18/2022 48.0 (H)    MCV (Mean Corpuscular Vo* 01/18/2022 80.4    MCH (Mean Corpuscular He* 01/18/2022 26.0 (L)    MCHC (Mean Corpuscular H* 01/18/2022 32.3    Platelet Count 01/18/2022 345      RDW-CV (Red Cell Distrib* 01/18/2022 13.8    MPV (Mean Platelet Volum* 01/18/2022 10.2    Neutrophils 01/18/2022 5.89    Lymphocytes 01/18/2022 2.42    Monocytes 01/18/2022 0.60    Eosinophils 01/18/2022 0.28    Basophils 01/18/2022 0.06    Neutrophil % 01/18/2022 63.5    Lymphocyte % 01/18/2022 26.1    Monocyte % 01/18/2022 6.5    Eosinophil % 01/18/2022 3.0    Basophil% 01/18/2022 0.6    Immature Granulocyte % 01/18/2022 0.3    Immature Granulocyte Cou* 01/18/2022 0.03    Glucose 01/18/2022 142 (H)    Sodium 01/18/2022 140    Potassium 01/18/2022 4.1    Chloride 01/18/2022 103    Carbon Dioxide (CO2) 01/18/2022 24.6    Urea Nitrogen (BUN) 01/18/2022 15    Creatinine 01/18/2022 0.4 (L)    Glomerular Filtration Ra* 01/18/2022 117    Calcium 01/18/2022 9.4    AST  01/18/2022 9    ALT  01/18/2022 21    Alk Phos (alkaline Phosp* 01/18/2022 131 (H)    Albumin 01/18/2022 4.4    Bilirubin, Total 01/18/2022 0.4    Protein, Total 01/18/2022 7.4    A/G Ratio  01/18/2022 1.5    Hemoglobin A1C 01/18/2022 7.3 (H)    Average Blood Glucose (C* 01/18/2022 163    Cholesterol, Total 01/18/2022 207 (H)    Triglyceride 01/18/2022 261 (H)    HDL (High Density Lipopr* 01/18/2022 47.2    LDL Calculated 01/18/2022 108    VLDL Cholesterol 01/18/2022 52    Cholesterol/HDL Ratio 01/18/2022 4.4    Creatinine, Random Urine 01/18/2022 70.3    Urine Albumin, Random 01/18/2022 10    Urine Albumin/Creatinine* 01/18/2022 14.2    Thyroid Stimulating Horm* 01/18/2022 1.810    Color 01/18/2022 Light Yellow    Clarity 01/18/2022 Clear    Specific Gravity 01/18/2022 >1.030 (H)    pH, Urine 01/18/2022 5.5    Protein, Urinalysis 01/18/2022 Negative    Glucose, Urinalysis 01/18/2022 4+ (!)    Ketones, Urinalysis 01/18/2022 Negative    Blood, Urinalysis 01/18/2022 Negative    Nitrite, Urinalysis 01/18/2022 Negative    Leukocyte Esterase, Urin* 01/18/2022 Negative    Bilirubin, Urinalysis 01/18/2022 Negative    Urobilinogen, Urinalysis 01/18/2022 0.2    WBC, UA 01/18/2022 <1    Red Blood Cells, Urinaly* 01/18/2022 3    Bacteria, Urinalysis 01/18/2022 0-5    Squamous Epithelial Cell* 01/18/2022 2    Vitamin B12 01/18/2022 221 (L)      ASSESSMENT: Ms. Inniss is a 57 y.o. female presenting for consultation for right breast atypical ductal hyperplasia.    Patient was oriented again about the pathology results. Surgical alternatives were discussed with patient including excisional biopsy. Surgical technique and post operative care was discussed with patient. Risk of surgery was discussed with patient including but not limited to: wound infection, seroma, hematoma, brachial plexopathy, mondor's disease (thrombosis of small veins of breast), chronic wound pain, breast lymphedema, altered sensation to the nipple and cosmesis among others.   I had a long discussion with the patient regarding the recommendation of the excisional biopsy of the right breast.  With finding of  atypical ductal hyperplasia discussed with patient that there could be higher than 10% risk of upgrading to DCIS or invasive carcinoma.  Regarding the left breast with lobular neoplasia which has been found in the past and a previous biopsy of the right breast we discussed about option of observation versus excisional biopsy.  Patient   currently anxious about these recommendations and she would like to go step-by-step proceeding with excisional biopsy of the right breast with atypical ductal hyperplasia.  If no malignancy identified patient will consider close a separation of the left breast.  I also discussed with patient that I will recommend again to get evaluation by medical oncology for evaluation of breast cancer risk reducing alternatives.  Atypical ductal hyperplasia of right breast [N60.91]  PLAN: Right breast radiofrequency tag excisional biopsy (19125) Avoid taking aspirin or any blood thinner 5 days before the surgery Contact us if you have any concern  Patient verbalized understanding, all questions were answered, and were agreeable with the plan outlined above.     Braxson Hollingsworth Cintron-Diaz, Mack  

## 2022-06-01 NOTE — H&P (Signed)
PATIENT PROFILE: MAUDEEN KIMPEL is a 57 y.o. female who presents to the Clinic for consultation at the request of Dr. Hyacinth Meeker for evaluation of right breast atypical ductal hyperplasia.Marland Kitchen  PCP:  Carlynn Purl, MD  HISTORY OF PRESENT ILLNESS: Ms. Sutter reports she had her usual screening mammogram on 04/29/2022.  She was found with possible distortion of both breast.  This led to diagnostic mammogram.  Diagnostic mammogram confirmed that there was innumerable pleomorphic microcalcifications throughout the breast predominantly in the upper inner quadrant and upper outer quadrant.  These measure a span of a time 7 cm.  On the right breast there is an intermediate group of calcifications in the right upper outer quadrant.  This measured 2 cm.  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  The 2 main area of calcification on the left breast will also biopsied showing lobular neoplasia.  Important to note patient had atypical lobar hyperplasia with microcalcifications in the right breast.  She had excisional biopsy confirming atypical lobar hyperplasia.  As per evaluation of note from Dr. Katrinka Blazing from 12/10/2015 and 02/09/2016 there was recommendation to get evaluation by medical oncology for discussion of risk reducing measure due to her high risk of breast cancer.  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   Overview    Post lumpectomy 2018       B12 deficiency 10/18/2013   Overview     237, 9/19       PCOS (polycystic ovarian syndrome) 06/22/2013    GENERAL REVIEW OF SYSTEMS:   General ROS: negative for - chills, fatigue, fever, weight gain or weight loss Allergy and Immunology ROS: negative for - hives  Hematological and Lymphatic ROS: negative for - bleeding problems or bruising, negative for palpable nodes Endocrine ROS: negative for - heat or cold intolerance, hair changes Respiratory ROS: negative for - cough, shortness of breath or wheezing Cardiovascular ROS: no chest pain or palpitations GI ROS: negative for nausea, vomiting, abdominal pain, diarrhea, constipation Musculoskeletal ROS: negative for - joint swelling or muscle pain Neurological ROS: negative for - confusion, syncope Dermatological ROS: negative for pruritus and rash Psychiatric: negative for anxiety, depression, difficulty sleeping and memory loss  MEDICATIONS: Current Outpatient Medications  Medication Sig Dispense Refill   amLODIPine (NORVASC) 10 MG tablet Take 1 tablet (10 mg total) by mouth at bedtime 90 tablet 3   empagliflozin (JARDIANCE) 10 mg tablet Take 1 tablet (10 mg total) by mouth once daily 90 tablet 3   ibuprofen (IBU ORAL) Take by mouth     losartan (COZAAR) 50 MG tablet Take 1 tablet (50 mg total) by mouth once daily 90 tablet 3   metFORMIN (GLUCOPHAGE) 500 MG tablet Take 1 tablet (500 mg total) by mouth once daily 90 tablet 3   multivitamin tablet Take 2 tablets by mouth once daily +IRON     phentermine (ADIPEX-P) 30 MG capsule Take 1 capsule (30 mg total) by  mouth every morning before breakfast 30 capsule 4   rosuvastatin (CRESTOR) 10 MG tablet Take 1 tablet by mouth once daily 90 tablet 0   sodium, potassium, and magnesium (SUPREP) oral solution Take 1 Bottle by mouth as directed One kit contains 2 bottles.  Take both bottles at the times instructed by your provider. 354 mL 0   venlafaxine (EFFEXOR-XR) 37.5 MG XR capsule Take 1 capsule (37.5 mg total) by mouth once daily  (Patient not taking: Reported on 05/18/2022) 90 capsule 3   No current facility-administered medications for this visit.    ALLERGIES: Patient has no known allergies.  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   Post lumpectomy 2018   Type 2 diabetes mellitus without complication (CMS/HHS-HCC) 05/20/2020    PAST SURGICAL HISTORY: Past Surgical History:  Procedure Laterality Date   Excision breast mass Right 2017   Atypical lobular hyperplasia   Colon @ [facility]  05/27/2022   Tubulovillous adenomas/   BREAST BIOPSY     BREAST SURGERY       FAMILY HISTORY: Family History  Problem Relation Age of Onset   Breast cancer Mother    High blood pressure (Hypertension) Mother    No Known Problems Father    Breast cancer Maternal Aunt    Breast cancer Maternal Grandmother    Colon cancer Maternal Grandfather      SOCIAL HISTORY: Social History   Socioeconomic History   Marital status: Married  Tobacco Use   Smoking status: Never   Smokeless tobacco: Never  Vaping Use   Vaping Use: Never used  Substance and Sexual Activity   Alcohol use: No   Drug use: Never   Sexual activity: Not Currently    Partners: Male    Birth control/protection: I.U.D., None  Social History Narrative   Married,.Works at [employer] in [place].    PHYSICAL EXAM: Vitals:   06/01/22 1428  BP: (!) 146/77  Pulse: (!) 111   Body mass index is 36.05 kg/m. Weight: 95.3 kg (210 lb 1.6 oz)   GENERAL: Alert, active, oriented x3  HEENT: Pupils equal reactive to light. Extraocular movements are intact. Sclera clear. Palpebral conjunctiva normal red color.Pharynx clear.  NECK: Supple with no palpable mass and no adenopathy.  LUNGS: Sound clear with no rales rhonchi or wheezes.  HEART: Regular  rhythm S1 and S2 without murmur.  BREAST: breasts appear normal, no suspicious masses, no skin or nipple changes or axillary nodes.  ABDOMEN: Soft and depressible, nontender with no palpable mass, no hepatomegaly.  EXTREMITIES: Well-developed well-nourished symmetrical with no dependent edema.  NEUROLOGICAL: Awake alert oriented, facial expression symmetrical, moving all extremities.  REVIEW OF DATA: I have reviewed the following data today: No visits with results within 3 Month(s) from this visit.  Latest known visit with results is:  Appointment on 01/18/2022  Component Date Value   WBC (White Blood Cell Co* 01/18/2022 9.3    RBC (Red Blood Cell Coun* 01/18/2022 5.97 (H)    Hemoglobin 01/18/2022 15.5 (H)    Hematocrit 01/18/2022 48.0 (H)    MCV (Mean Corpuscular Vo* 01/18/2022 80.4    MCH (Mean Corpuscular He* 01/18/2022 26.0 (L)    MCHC (Mean Corpuscular H* 01/18/2022 32.3    Platelet Count 01/18/2022 345  RDW-CV (Red Cell Distrib* 01/18/2022 13.8    MPV (Mean Platelet Volum* 01/18/2022 10.2    Neutrophils 01/18/2022 5.89    Lymphocytes 01/18/2022 2.42    Monocytes 01/18/2022 0.60    Eosinophils 01/18/2022 0.28    Basophils 01/18/2022 0.06    Neutrophil % 01/18/2022 63.5    Lymphocyte % 01/18/2022 26.1    Monocyte % 01/18/2022 6.5    Eosinophil % 01/18/2022 3.0    Basophil% 01/18/2022 0.6    Immature Granulocyte % 01/18/2022 0.3    Immature Granulocyte Cou* 01/18/2022 0.03    Glucose 01/18/2022 142 (H)    Sodium 01/18/2022 140    Potassium 01/18/2022 4.1    Chloride 01/18/2022 103    Carbon Dioxide (CO2) 01/18/2022 24.6    Urea Nitrogen (BUN) 01/18/2022 15    Creatinine 01/18/2022 0.4 (L)    Glomerular Filtration Ra* 01/18/2022 117    Calcium 01/18/2022 9.4    AST  01/18/2022 9    ALT  01/18/2022 21    Alk Phos (alkaline Phosp* 01/18/2022 131 (H)    Albumin 01/18/2022 4.4    Bilirubin, Total 01/18/2022 0.4    Protein, Total 01/18/2022 7.4    A/G Ratio  01/18/2022 1.5    Hemoglobin A1C 01/18/2022 7.3 (H)    Average Blood Glucose (C* 01/18/2022 163    Cholesterol, Total 01/18/2022 207 (H)    Triglyceride 01/18/2022 261 (H)    HDL (High Density Lipopr* 16/11/9602 47.2    LDL Calculated 01/18/2022 540    VLDL Cholesterol 01/18/2022 52    Cholesterol/HDL Ratio 01/18/2022 4.4    Creatinine, Random Urine 01/18/2022 70.3    Urine Albumin, Random 01/18/2022 10    Urine Albumin/Creatinine* 01/18/2022 14.2    Thyroid Stimulating Horm* 01/18/2022 1.810    Color 01/18/2022 Light Yellow    Clarity 01/18/2022 Clear    Specific Gravity 01/18/2022 >1.030 (H)    pH, Urine 01/18/2022 5.5    Protein, Urinalysis 01/18/2022 Negative    Glucose, Urinalysis 01/18/2022 4+ (!)    Ketones, Urinalysis 01/18/2022 Negative    Blood, Urinalysis 01/18/2022 Negative    Nitrite, Urinalysis 01/18/2022 Negative    Leukocyte Esterase, Urin* 01/18/2022 Negative    Bilirubin, Urinalysis 01/18/2022 Negative    Urobilinogen, Urinalysis 01/18/2022 0.2    WBC, UA 01/18/2022 <1    Red Blood Cells, Urinaly* 01/18/2022 3    Bacteria, Urinalysis 01/18/2022 0-5    Squamous Epithelial Cell* 01/18/2022 2    Vitamin B12 01/18/2022 221 (L)      ASSESSMENT: Ms. Albany is a 57 y.o. female presenting for consultation for right breast atypical ductal hyperplasia.    Patient was oriented again about the pathology results. Surgical alternatives were discussed with patient including excisional biopsy. Surgical technique and post operative care was discussed with patient. Risk of surgery was discussed with patient including but not limited to: wound infection, seroma, hematoma, brachial plexopathy, mondor's disease (thrombosis of small veins of breast), chronic wound pain, breast lymphedema, altered sensation to the nipple and cosmesis among others.   I had a long discussion with the patient regarding the recommendation of the excisional biopsy of the right breast.  With finding of  atypical ductal hyperplasia discussed with patient that there could be higher than 10% risk of upgrading to DCIS or invasive carcinoma.  Regarding the left breast with lobular neoplasia which has been found in the past and a previous biopsy of the right breast we discussed about option of observation versus excisional biopsy.  Patient  currently anxious about these recommendations and she would like to go step-by-step proceeding with excisional biopsy of the right breast with atypical ductal hyperplasia.  If no malignancy identified patient will consider close a separation of the left breast.  I also discussed with patient that I will recommend again to get evaluation by medical oncology for evaluation of breast cancer risk reducing alternatives.  Atypical ductal hyperplasia of right breast [N60.91]  PLAN: Right breast radiofrequency tag excisional biopsy ([id number]) Avoid taking aspirin or any blood thinner 5 days before the surgery Contact us if you have any concern  Patient verbalized understanding, all questions were answered, and were agreeable with the plan outlined above.     Carolan Shiver, MD

## 2022-06-03 ENCOUNTER — Other Ambulatory Visit: Payer: Self-pay | Admitting: General Surgery

## 2022-06-03 DIAGNOSIS — N6091 Unspecified benign mammary dysplasia of right breast: Secondary | ICD-10-CM

## 2022-06-07 ENCOUNTER — Ambulatory Visit
Admission: RE | Admit: 2022-06-07 | Discharge: 2022-06-07 | Disposition: A | Discharge status: Home / Self Care | Payer: Managed Care, Other (non HMO) | Source: Ambulatory Visit | Attending: General Surgery | Admitting: General Surgery

## 2022-06-07 DIAGNOSIS — N6091 Unspecified benign mammary dysplasia of right breast: Secondary | ICD-10-CM | POA: Insufficient documentation

## 2022-06-08 ENCOUNTER — Other Ambulatory Visit: Payer: Self-pay | Admitting: General Surgery

## 2022-06-08 DIAGNOSIS — N6091 Unspecified benign mammary dysplasia of right breast: Secondary | ICD-10-CM

## 2022-06-10 ENCOUNTER — Other Ambulatory Visit: Payer: Self-pay

## 2022-06-10 ENCOUNTER — Encounter
Admission: RE | Admit: 2022-06-10 | Discharge: 2022-06-10 | Disposition: A | Discharge status: Home / Self Care | Payer: Managed Care, Other (non HMO) | Source: Ambulatory Visit | Attending: General Surgery | Admitting: General Surgery

## 2022-06-10 VITALS — Ht 64.0 in | Wt 205.0 lb

## 2022-06-10 DIAGNOSIS — Z01818 Encounter for other preprocedural examination: Secondary | ICD-10-CM

## 2022-06-10 DIAGNOSIS — E119 Type 2 diabetes mellitus without complications: Secondary | ICD-10-CM

## 2022-06-10 DIAGNOSIS — I10 Essential (primary) hypertension: Secondary | ICD-10-CM

## 2022-06-10 HISTORY — DX: Type 2 diabetes mellitus without complications: E11.9

## 2022-06-10 HISTORY — DX: Essential (primary) hypertension: I10

## 2022-06-10 HISTORY — DX: Hirsutism: L68.0

## 2022-06-10 HISTORY — DX: Hyperlipidemia, unspecified: E78.5

## 2022-06-10 NOTE — Patient Instructions (Addendum)
Your procedure is scheduled on: Wednesday 06/16/22 To find out your arrival time, please call [phone] between 1PM - 3PM on:   Tuesday 06/15/22 Report to the Registration Desk on the 1st floor of the [facility]. Valet parking is available.  If your arrival time is 6:00 am, do not arrive before that time as the [facility] entrance doors do not open until 6:00 am.  REMEMBER: Instructions that are not followed completely may result in serious medical risk, up to and including death; or upon the discretion of your surgeon and anesthesiologist your surgery may need to be rescheduled.  Do not eat food or drink any liquids after midnight the night before surgery.  No gum chewing or hard candies.  One week prior to surgery: Stop Anti-inflammatories (NSAIDS) such as Advil, Aleve, Ibuprofen, Motrin, Naproxen, Naprosyn and Aspirin based products such as Excedrin, Goody's Powder, BC Powder. You may however, continue to take Tylenol if needed for pain up until the day of surgery.  Stop ANY OVER THE COUNTER supplements until after surgery.  Continue taking all prescribed medications with the exception of the following: Jardiance, hold for 3 days last dose on Saturday 06/12/22. Metformin, hold for 2 days last dose on Sunday 06/13/22  TAKE ONLY THESE MEDICATIONS THE MORNING OF SURGERY WITH A SIP OF WATER:  amLODipine (NORVASC) 10 MG tablet  rosuvastatin (CRESTOR) 10 MG tablet   No Smoking including e-cigarettes for 24 hours before surgery.  No chewable tobacco products for at least 6 hours before surgery.  No nicotine patches on the day of surgery.  Do not use any "recreational" drugs for at least a week (preferably 2 weeks) before your surgery.  Please be advised that the combination of cocaine and anesthesia may have negative outcomes, up to and including death. If you test positive for cocaine, your surgery will be cancelled.  On the morning of surgery brush your teeth with toothpaste and  water, you may rinse your mouth with mouthwash if you wish. Do not swallow any toothpaste or mouthwash.  Use CHG Soap or wipes as directed on instruction sheet.  Do not wear lotions, powders, or perfumes. No deodorant.  Do not shave body hair from the neck down 48 hours before surgery.  Wear comfortable clothing (specific to your surgery type) to the hospital.  Do not wear jewelry, make-up, hairpins, clips or nail polish.  Contact lenses, hearing aids and dentures may not be worn into surgery.  Do not bring valuables to the hospital. [facility] is not responsible for any missing/lost belongings or valuables.   Notify your doctor if there is any change in your medical condition (cold, fever, infection).  If you are being discharged the day of surgery, you will not be allowed to drive home. You will need a responsible individual to drive you home and stay with you for 24 hours after surgery.   If you are taking public transportation, you will need to have a responsible individual with you.  If you are being admitted to the hospital overnight, leave your suitcase in the car. After surgery it may be brought to your room.  In case of increased patient census, it may be necessary for you, the patient, to continue your postoperative care in the Same Day Surgery department.  After surgery, you can help prevent lung complications by doing breathing exercises.  Take deep breaths and cough every 1-2 hours. Your doctor may order a device called an Incentive Spirometer to help you take  deep breaths. When coughing or sneezing, hold a pillow firmly against your incision with both hands. This is called "splinting." Doing this helps protect your incision. It also decreases belly discomfort.  Surgery Visitation Policy:  Patients undergoing a surgery or procedure may have two family members or support persons with them as long as the person is not COVID-19 positive or experiencing its symptoms.    Inpatient Visitation:    Visiting hours are 7 a.m. to 8 p.m. Up to four visitors are allowed at one time in a patient room. The visitors may rotate out with other people during the day. One designated support person (adult) may remain overnight.  Please call the Pre-admissions Testing Dept. at [phone] if you have any questions about these instructions.     Preparing for Surgery with CHLORHEXIDINE GLUCONATE (CHG) Soap  Chlorhexidine Gluconate (CHG) Soap  o An antiseptic cleaner that kills germs and bonds with the skin to continue killing germs even after washing  o Used for showering the night before surgery and morning of surgery  Before surgery, you can play an important role by reducing the number of germs on your skin.  CHG (Chlorhexidine gluconate) soap is an antiseptic cleanser which kills germs and bonds with the skin to continue killing germs even after washing.  Please do not use if you have an allergy to CHG or antibacterial soaps. If your skin becomes reddened/irritated stop using the CHG.  1. Shower the NIGHT BEFORE SURGERY and the MORNING OF SURGERY with CHG soap.  2. If you choose to wash your hair, wash your hair first as usual with your normal shampoo.  3. After shampooing, rinse your hair and body thoroughly to remove the shampoo.  4. Use CHG as you would any other liquid soap. You can apply CHG directly to the skin and wash gently with a scrungie or a clean washcloth.  5. Apply the CHG soap to your body only from the neck down. Do not use on open wounds or open sores. Avoid contact with your eyes, ears, mouth, and genitals (private parts). Wash face and genitals (private parts) with your normal soap.  6. Wash thoroughly, paying special attention to the area where your surgery will be performed.  7. Thoroughly rinse your body with warm water.  8. Do not shower/wash with your normal soap after using and rinsing off the CHG soap.  9. Pat yourself dry  with a clean towel.  10. Wear clean pajamas to bed the night before surgery.  12. Place clean sheets on your bed the night of your first shower and do not sleep with pets.  13. Shower again with the CHG soap on the day of surgery prior to arriving at the hospital.  14. Do not apply any deodorants/lotions/powders.  15. Please wear clean clothes to the hospital.

## 2022-06-11 ENCOUNTER — Ambulatory Visit
Admission: RE | Admit: 2022-06-11 | Discharge: 2022-06-11 | Disposition: A | Discharge status: Home / Self Care | Payer: Managed Care, Other (non HMO) | Source: Ambulatory Visit | Attending: General Surgery | Admitting: General Surgery

## 2022-06-11 ENCOUNTER — Encounter
Admission: RE | Admit: 2022-06-11 | Discharge: 2022-06-11 | Disposition: A | Discharge status: Home / Self Care | Payer: Managed Care, Other (non HMO) | Source: Ambulatory Visit | Attending: General Surgery

## 2022-06-11 DIAGNOSIS — I10 Essential (primary) hypertension: Secondary | ICD-10-CM

## 2022-06-11 DIAGNOSIS — Z01810 Encounter for preprocedural cardiovascular examination: Secondary | ICD-10-CM | POA: Diagnosis not present

## 2022-06-11 DIAGNOSIS — N6091 Unspecified benign mammary dysplasia of right breast: Secondary | ICD-10-CM | POA: Diagnosis not present

## 2022-06-11 DIAGNOSIS — Z01818 Encounter for other preprocedural examination: Secondary | ICD-10-CM | POA: Insufficient documentation

## 2022-06-11 DIAGNOSIS — E119 Type 2 diabetes mellitus without complications: Secondary | ICD-10-CM

## 2022-06-11 LAB — BASIC METABOLIC PANEL
Anion gap: 11 (ref 5–15)
BUN: 11 mg/dL (ref 6–20)
CO2: 23 mmol/L (ref 22–32)
Calcium: 9.2 mg/dL (ref 8.9–10.3)
Chloride: 104 mmol/L (ref 98–111)
Creatinine, Ser: 0.42 mg/dL — ABNORMAL LOW (ref 0.44–1.00)
GFR, Estimated: >60 mL/min (ref >60)
Glucose, Bld: 135 mg/dL — ABNORMAL HIGH (ref 70–99)
Potassium: 3.5 mmol/L (ref 3.5–5.1)
Sodium: 138 mmol/L (ref 135–145)

## 2022-06-11 LAB — CBC
HCT: 48.7 % — ABNORMAL HIGH (ref 36.0–46.0)
Hemoglobin: 15.6 g/dL — ABNORMAL HIGH (ref 12.0–15.0)
MCH: 26.7 pg (ref 26.0–34.0)
MCHC: 32 g/dL (ref 30.0–36.0)
MCV: 83.2 fL (ref 80.0–100.0)
Platelets: 324 10*3/uL (ref 150–400)
RBC: 5.85 MIL/uL — ABNORMAL HIGH (ref 3.87–5.11)
RDW: 12.5 % (ref 11.5–15.5)
WBC: 8.7 10*3/uL (ref 4.0–10.5)
nRBC: 0 % (ref 0.0–0.2)

## 2022-06-11 MED ORDER — GADOBUTROL 1 MMOL/ML IV SOLN
9.0000 mL | Freq: Once | INTRAVENOUS | Status: AC | PRN
  Administered 2022-06-11: 9 mL via INTRAVENOUS

## 2022-06-16 ENCOUNTER — Encounter: Admission: RE | Payer: Self-pay | Source: Home / Self Care

## 2022-06-16 ENCOUNTER — Ambulatory Visit
Admission: RE | Admit: 2022-06-16 | Payer: Managed Care, Other (non HMO) | Source: Home / Self Care | Admitting: General Surgery

## 2022-06-16 ENCOUNTER — Inpatient Hospital Stay: Admission: RE | Admit: 2022-06-16 | Payer: Managed Care, Other (non HMO) | Source: Ambulatory Visit

## 2022-06-16 SURGERY — BREAST BIOPSY WITH RADIO FREQUENCY LOCALIZER
Anesthesia: General | Site: Breast | Laterality: Right

## 2022-06-22 ENCOUNTER — Ambulatory Visit
Admission: RE | Admit: 2022-06-22 | Discharge: 2022-06-22 | Disposition: A | Discharge status: Home / Self Care | Payer: Managed Care, Other (non HMO) | Source: Ambulatory Visit | Attending: General Surgery | Admitting: General Surgery

## 2022-06-22 DIAGNOSIS — N6091 Unspecified benign mammary dysplasia of right breast: Secondary | ICD-10-CM | POA: Diagnosis present

## 2022-06-22 HISTORY — PX: BREAST BIOPSY: SHX20

## 2022-06-22 MED ORDER — LIDOCAINE HCL (PF) 1 % IJ SOLN
10.0000 mL | Freq: Once | INTRAMUSCULAR | Status: AC
  Administered 2022-06-22: 10 mL

## 2022-06-25 ENCOUNTER — Ambulatory Visit
Admission: RE | Admit: 2022-06-25 | Discharge: 2022-06-25 | Disposition: A | Discharge status: Home / Self Care | Payer: Managed Care, Other (non HMO) | Attending: General Surgery | Admitting: General Surgery

## 2022-06-25 ENCOUNTER — Other Ambulatory Visit: Payer: Self-pay

## 2022-06-25 ENCOUNTER — Encounter: Payer: Self-pay | Admitting: General Surgery

## 2022-06-25 ENCOUNTER — Encounter
Admission: RE | Disposition: A | Discharge status: Home / Self Care | Payer: Self-pay | Source: Home / Self Care | Attending: General Surgery

## 2022-06-25 ENCOUNTER — Ambulatory Visit: Payer: Managed Care, Other (non HMO) | Admitting: General Practice

## 2022-06-25 ENCOUNTER — Ambulatory Visit
Admission: RE | Admit: 2022-06-25 | Discharge: 2022-06-25 | Disposition: A | Discharge status: Home / Self Care | Payer: Managed Care, Other (non HMO) | Source: Ambulatory Visit | Attending: General Surgery | Admitting: General Surgery

## 2022-06-25 DIAGNOSIS — N6021 Fibroadenosis of right breast: Secondary | ICD-10-CM | POA: Insufficient documentation

## 2022-06-25 DIAGNOSIS — I10 Essential (primary) hypertension: Secondary | ICD-10-CM | POA: Diagnosis not present

## 2022-06-25 DIAGNOSIS — E119 Type 2 diabetes mellitus without complications: Secondary | ICD-10-CM | POA: Diagnosis not present

## 2022-06-25 DIAGNOSIS — N6489 Other specified disorders of breast: Secondary | ICD-10-CM | POA: Diagnosis not present

## 2022-06-25 DIAGNOSIS — N6091 Unspecified benign mammary dysplasia of right breast: Secondary | ICD-10-CM

## 2022-06-25 DIAGNOSIS — Z803 Family history of malignant neoplasm of breast: Secondary | ICD-10-CM | POA: Diagnosis not present

## 2022-06-25 DIAGNOSIS — Z7984 Long term (current) use of oral hypoglycemic drugs: Secondary | ICD-10-CM | POA: Insufficient documentation

## 2022-06-25 DIAGNOSIS — Z01818 Encounter for other preprocedural examination: Secondary | ICD-10-CM

## 2022-06-25 HISTORY — PX: BREAST LUMPECTOMY WITH RADIO FREQUENCY LOCALIZER: SHX6897

## 2022-06-25 LAB — GLUCOSE, CAPILLARY
Glucose-Capillary: 155 mg/dL — ABNORMAL HIGH (ref 70–99)
Glucose-Capillary: 153 mg/dL — ABNORMAL HIGH (ref 70–99)

## 2022-06-25 LAB — POCT PREGNANCY, URINE: Preg Test, Ur: NEGATIVE

## 2022-06-25 SURGERY — BREAST LUMPECTOMY WITH RADIO FREQUENCY LOCALIZER
Anesthesia: General | Site: Breast | Laterality: Right

## 2022-06-25 MED ORDER — DEXAMETHASONE SODIUM PHOSPHATE 10 MG/ML IJ SOLN
INTRAMUSCULAR | Status: AC
  Filled 2022-06-25: qty 1

## 2022-06-25 MED ORDER — DROPERIDOL 2.5 MG/ML IJ SOLN
0.6250 mg | Freq: Once | INTRAMUSCULAR | Status: AC
  Administered 2022-06-25: 0.625 mg via INTRAVENOUS

## 2022-06-25 MED ORDER — ONDANSETRON HCL 4 MG/2ML IJ SOLN
INTRAMUSCULAR | Status: AC
  Filled 2022-06-25: qty 2

## 2022-06-25 MED ORDER — FENTANYL CITRATE (PF) 100 MCG/2ML IJ SOLN
INTRAMUSCULAR | Status: DC | PRN
  Administered 2022-06-25: 25 ug via INTRAVENOUS
  Administered 2022-06-25: 50 ug via INTRAVENOUS
  Administered 2022-06-25: 25 ug via INTRAVENOUS
  Administered 2022-06-25: 50 ug via INTRAVENOUS

## 2022-06-25 MED ORDER — ONDANSETRON HCL 4 MG/2ML IJ SOLN
INTRAMUSCULAR | Status: DC | PRN
  Administered 2022-06-25: 4 mg via INTRAVENOUS

## 2022-06-25 MED ORDER — LIDOCAINE HCL (CARDIAC) PF 100 MG/5ML IV SOSY
PREFILLED_SYRINGE | INTRAVENOUS | Status: DC | PRN
  Administered 2022-06-25: 100 mg via INTRAVENOUS

## 2022-06-25 MED ORDER — MIDAZOLAM HCL 2 MG/2ML IJ SOLN
INTRAMUSCULAR | Status: DC | PRN
  Administered 2022-06-25: 2 mg via INTRAVENOUS

## 2022-06-25 MED ORDER — ACETAMINOPHEN 500 MG PO TABS
1000.0000 mg | ORAL_TABLET | Freq: Once | ORAL | Status: AC
  Administered 2022-06-25: 1000 mg via ORAL

## 2022-06-25 MED ORDER — CEFAZOLIN SODIUM-DEXTROSE 2-4 GM/100ML-% IV SOLN
INTRAVENOUS | Status: AC
  Filled 2022-06-25: qty 100

## 2022-06-25 MED ORDER — FENTANYL CITRATE (PF) 100 MCG/2ML IJ SOLN
INTRAMUSCULAR | Status: AC
  Filled 2022-06-25: qty 2

## 2022-06-25 MED ORDER — DEXAMETHASONE SODIUM PHOSPHATE 10 MG/ML IJ SOLN
INTRAMUSCULAR | Status: DC | PRN
  Administered 2022-06-25: 10 mg via INTRAVENOUS

## 2022-06-25 MED ORDER — SODIUM CHLORIDE 0.9 % IV SOLN: INTRAVENOUS | Status: DC

## 2022-06-25 MED ORDER — ACETAMINOPHEN 500 MG PO TABS
ORAL_TABLET | ORAL | Status: AC
  Filled 2022-06-25: qty 2

## 2022-06-25 MED ORDER — KETOROLAC TROMETHAMINE 30 MG/ML IJ SOLN
INTRAMUSCULAR | Status: AC
  Filled 2022-06-25: qty 1

## 2022-06-25 MED ORDER — EPINEPHRINE PF 1 MG/ML IJ SOLN
INTRAMUSCULAR | Status: AC
  Filled 2022-06-25: qty 1

## 2022-06-25 MED ORDER — FAMOTIDINE 20 MG PO TABS
20.0000 mg | ORAL_TABLET | Freq: Once | ORAL | Status: AC
  Administered 2022-06-25: 20 mg via ORAL

## 2022-06-25 MED ORDER — HYDROCODONE-ACETAMINOPHEN 5-325 MG PO TABS: 1.0000 | ORAL_TABLET | ORAL | 0 refills | Status: AC | PRN

## 2022-06-25 MED ORDER — ORAL CARE MOUTH RINSE: 15.0000 mL | Freq: Once | OROMUCOSAL | Status: AC

## 2022-06-25 MED ORDER — PROPOFOL 10 MG/ML IV BOLUS
INTRAVENOUS | Status: AC
  Filled 2022-06-25: qty 20

## 2022-06-25 MED ORDER — MIDAZOLAM HCL 2 MG/2ML IJ SOLN
INTRAMUSCULAR | Status: AC
  Filled 2022-06-25: qty 2

## 2022-06-25 MED ORDER — CHLORHEXIDINE GLUCONATE 0.12 % MT SOLN
OROMUCOSAL | Status: AC
  Filled 2022-06-25: qty 15

## 2022-06-25 MED ORDER — CEFAZOLIN SODIUM-DEXTROSE 2-4 GM/100ML-% IV SOLN
2.0000 g | INTRAVENOUS | Status: AC
  Administered 2022-06-25: 2 g via INTRAVENOUS

## 2022-06-25 MED ORDER — PROPOFOL 10 MG/ML IV BOLUS
INTRAVENOUS | Status: DC | PRN
  Administered 2022-06-25: 200 mg via INTRAVENOUS
  Administered 2022-06-25: 30 mg via INTRAVENOUS

## 2022-06-25 MED ORDER — FAMOTIDINE 20 MG PO TABS
ORAL_TABLET | ORAL | Status: AC
  Filled 2022-06-25: qty 1

## 2022-06-25 MED ORDER — FENTANYL CITRATE (PF) 100 MCG/2ML IJ SOLN
25.0000 ug | INTRAMUSCULAR | Status: DC | PRN
  Administered 2022-06-25: 25 ug via INTRAVENOUS

## 2022-06-25 MED ORDER — KETOROLAC TROMETHAMINE 30 MG/ML IJ SOLN
INTRAMUSCULAR | Status: DC | PRN
  Administered 2022-06-25: 15 mg via INTRAVENOUS

## 2022-06-25 MED ORDER — BUPIVACAINE HCL (PF) 0.5 % IJ SOLN
INTRAMUSCULAR | Status: AC
  Filled 2022-06-25: qty 30

## 2022-06-25 MED ORDER — DROPERIDOL 2.5 MG/ML IJ SOLN
INTRAMUSCULAR | Status: AC
  Filled 2022-06-25: qty 2

## 2022-06-25 MED ORDER — BUPIVACAINE-EPINEPHRINE (PF) 0.5% -1:200000 IJ SOLN
INTRAMUSCULAR | Status: DC | PRN
  Administered 2022-06-25: 10 mL

## 2022-06-25 MED ORDER — LIDOCAINE HCL (PF) 2 % IJ SOLN
INTRAMUSCULAR | Status: AC
  Filled 2022-06-25: qty 5

## 2022-06-25 MED ORDER — OXYCODONE HCL 5 MG/5ML PO SOLN: 5.0000 mg | Freq: Once | ORAL | Status: DC | PRN

## 2022-06-25 MED ORDER — OXYCODONE HCL 5 MG PO TABS: 5.0000 mg | ORAL_TABLET | Freq: Once | ORAL | Status: DC | PRN

## 2022-06-25 MED ORDER — CHLORHEXIDINE GLUCONATE 0.12 % MT SOLN
15.0000 mL | Freq: Once | OROMUCOSAL | Status: AC
  Administered 2022-06-25: 15 mL via OROMUCOSAL

## 2022-06-25 SURGICAL SUPPLY — 42 items
ADH SKN CLS APL DERMABOND .7 (GAUZE/BANDAGES/DRESSINGS) ×1
APL PRP STRL LF DISP 70% ISPRP (MISCELLANEOUS) ×1
BLADE SURG 15 STRL LF DISP TIS (BLADE) ×1 IMPLANT
BLADE SURG 15 STRL SS (BLADE) ×1
CHLORAPREP W/TINT 26 (MISCELLANEOUS) ×1 IMPLANT
CNTNR URN SCR LID CUP LEK RST (MISCELLANEOUS) ×1 IMPLANT
CONT SPEC 4OZ STRL OR WHT (MISCELLANEOUS) ×1
DERMABOND ADVANCED .7 DNX12 (GAUZE/BANDAGES/DRESSINGS) ×1 IMPLANT
DEVICE DUBIN SPECIMEN MAMMOGRA (MISCELLANEOUS) ×1 IMPLANT
DRAPE LAPAROTOMY TRNSV 106X77 (MISCELLANEOUS) ×1 IMPLANT
ELECT CAUTERY BLADE TIP 2.5 (TIP) ×1
ELECT REM PT RETURN 9FT ADLT (ELECTROSURGICAL) ×1
ELECTRODE CAUTERY BLDE TIP 2.5 (TIP) ×1 IMPLANT
ELECTRODE REM PT RTRN 9FT ADLT (ELECTROSURGICAL) ×1 IMPLANT
GAUZE 4X4 16PLY ~~LOC~~+RFID DBL (SPONGE) ×1 IMPLANT
GLOVE BIO SURGEON STRL SZ 6.5 (GLOVE) ×1 IMPLANT
GLOVE BIOGEL PI IND STRL 6.5 (GLOVE) ×1 IMPLANT
GOWN STRL REUS W/ TWL LRG LVL3 (GOWN DISPOSABLE) ×3 IMPLANT
GOWN STRL REUS W/TWL LRG LVL3 (GOWN DISPOSABLE) ×4
KIT MARKER MARGIN INK (KITS) IMPLANT
KIT TURNOVER KIT A (KITS) ×1 IMPLANT
LABEL OR SOLS (LABEL) ×1 IMPLANT
MANIFOLD NEPTUNE II (INSTRUMENTS) ×1 IMPLANT
MARKER MARGIN CORRECT CLIP (MARKER) IMPLANT
NDL HYPO 25X1 1.5 SAFETY (NEEDLE) ×1 IMPLANT
NEEDLE HYPO 25X1 1.5 SAFETY (NEEDLE) ×1 IMPLANT
PACK BASIN MINOR ARMC (MISCELLANEOUS) ×1 IMPLANT
RETRACTOR RING XSMALL (MISCELLANEOUS) IMPLANT
RTRCTR WOUND ALEXIS 13CM XS SH (MISCELLANEOUS) ×1
SET LOCALIZER 20 PROBE US (MISCELLANEOUS) ×1 IMPLANT
SUT MNCRL 4-0 (SUTURE) ×1
SUT MNCRL 4-0 27XMFL (SUTURE) ×1
SUT SILK 2 0 SH (SUTURE) ×1 IMPLANT
SUT VIC AB 3-0 SH 27 (SUTURE) ×1
SUT VIC AB 3-0 SH 27X BRD (SUTURE) ×1 IMPLANT
SUTURE MNCRL 4-0 27XMF (SUTURE) ×1 IMPLANT
SYR 10ML LL (SYRINGE) ×1 IMPLANT
SYR BULB IRRIG 60ML STRL (SYRINGE) ×1 IMPLANT
TRAP FLUID SMOKE EVACUATOR (MISCELLANEOUS) ×1 IMPLANT
TRAP NEPTUNE SPECIMEN COLLECT (MISCELLANEOUS) ×1 IMPLANT
WATER STERILE IRR 1000ML POUR (IV SOLUTION) ×1 IMPLANT
WATER STERILE IRR 500ML POUR (IV SOLUTION) ×1 IMPLANT

## 2022-06-25 NOTE — Transfer of Care (Signed)
Immediate Anesthesia Transfer of Care Note  Patient: Monica Mack  Procedure(s) Performed: BREAST LUMPECTOMY WITH RADIO FREQUENCY LOCALIZER (Right: Breast)  Patient Location: PACU  Anesthesia Type:General  Level of Consciousness: sedated  Airway & Oxygen Therapy: Patient Spontanous Breathing  Post-op Assessment: Report given to RN  Post vital signs: Reviewed  Last Vitals:  Vitals Value Taken Time  BP    Temp    Pulse    Resp    SpO2      Last Pain:  Vitals:   06/25/22 0805  TempSrc: Temporal  PainSc: 0-No pain        Past Medical History:  Diagnosis Date   Depression    Diabetes mellitus without complication (HCC)    GERD (gastroesophageal reflux disease)    TUMS PRN   Hirsutism    HLD (hyperlipidemia)    Hypertension    Lower leg edema    Past Surgical History:  Procedure Laterality Date   BREAST BIOPSY Right 11/26/2015   atypical lobular hyperplasia-lumpectomy 01/27/2016   BREAST BIOPSY Left 05/11/2022   Left Breast Stereo Bx,  Ribbon Clip path pending   BREAST BIOPSY Left 05/11/2022   Left Breast Stereo Bx #1- Coil clip path pending   BREAST BIOPSY Right 05/11/2022   Right Breast Stereo Bx- X Clip path pending   BREAST BIOPSY Right 05/11/2022   MM RT BREAST BX W LOC DEV 1ST LESION IMAGE BX SPEC STEREO GUIDE 05/11/2022 [facility]   BREAST BIOPSY Left 05/11/2022   MM LT BREAST BX W LOC DEV 1ST LESION IMAGE BX SPEC STEREO GUIDE 05/11/2022 [facility]   BREAST BIOPSY Left 05/11/2022   MM LT BREAST BX W LOC DEV EA AD LESION IMG BX SPEC STEREO GUIDE 05/11/2022 [facility]   BREAST BIOPSY Right 06/22/2022   MM RT RADIO FREQUENCY TAG LOC MAMMO GUIDE 06/22/2022 [facility]   BREAST EXCISIONAL BIOPSY Right 01/27/2016   ALH excisied, - MARGINS ARE NEGATIVE FOR ATYPIA.    BREAST LUMPECTOMY WITH NEEDLE LOCALIZATION Right 01/27/2016   Procedure: BREAST LUMPECTOMY WITH NEEDLE LOCALIZATION;  Surgeon: Nadeen Landau, MD;  Location: [facility];   Service: General;  Laterality: Right;   Scheduled Meds: Continuous Infusions:  sodium chloride 10 mL/hr at 06/25/22 0924   PRN Meds:.   Complications: No notable events documented.

## 2022-06-25 NOTE — Interval H&P Note (Signed)
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  The various methods of treatment have been discussed with the patient and family. After consideration of risks, benefits and other options for treatment, the patient has consented to  Procedure(s): BREAST LUMPECTOMY WITH RADIO FREQUENCY LOCALIZER (Right) as a surgical intervention.  The patient's history has been reviewed, patient examined, no change in status, stable for surgery.  I have reviewed the patient's chart and labs.  Questions were answered to the patient's satisfaction.     Carolan Shiver

## 2022-06-25 NOTE — Op Note (Signed)
Preoperative diagnosis: Right breast atypical ductal hyperplasia.  Postoperative diagnosis: Same.   Procedure: Right radiofrequency tag-localized excisional biopsy.                      Anesthesia: GETA  Surgeon: Dr. Hazle Quant  Wound Classification: Clean  Indications: Patient is a 57 y.o. female with a nonpalpable right breast mass noted on mammography with core biopsy demonstrating atypical ductal hyperplasia requires radiofrequency tag-localized excisional biopsy to rule out malignancy.   Findings: 1. Specimen mammography shows marker and tag on specimen 2. No other palpable mass or lymph node identified.   Description of procedure: Preoperative radiofrequency tag localization was performed by radiology. The patient was taken to the operating room and placed supine on the operating table, and after general anesthesia the right chest was prepped and draped in the usual sterile fashion. A time-out was completed verifying correct patient, procedure, site, positioning, and implant(s) and/or special equipment prior to beginning this procedure.  By comparing the localization studies and interrogation with Localizer device, the probable trajectory and location of the mass was visualized. A circumareolar skin incision was planned in such a way as to minimize the amount of dissection to reach the mass.  The skin incision was made. Flaps were raised and the location of the tag was confirmed with Localizer device confirmed. A 2-0 silk figure-of-eight stay suture was placed and used for retraction. Dissection was then taken down circumferentially, taking care to include the entire localizing tag and a wide margin of grossly normal tissue. The specimen and entire localizing tag were removed. The specimen was oriented and sent to radiology with the localization studies. Confirmation was received that the entire target lesion had been resected. The wound was irrigated. Hemostasis was checked. The wound  was closed with interrupted sutures of 3-0 Vicryl and a subcuticular suture of Monocryl 3-0. No attempt was made to close the dead space.   Specimen: Right breast excisional biopsy                      Complications: None  Estimated Blood Loss: 2 mL

## 2022-06-25 NOTE — Anesthesia Postprocedure Evaluation (Signed)
Anesthesia Post Note  Patient: Monica Mack  Procedure(s) Performed: BREAST LUMPECTOMY WITH RADIO FREQUENCY LOCALIZER (Right: Breast)  Patient location during evaluation: PACU Anesthesia Type: General Level of consciousness: awake and alert Pain management: pain level controlled Vital Signs Assessment: post-procedure vital signs reviewed and stable Respiratory status: spontaneous breathing, nonlabored ventilation, respiratory function stable and patient connected to nasal cannula oxygen Cardiovascular status: blood pressure returned to baseline and stable Postop Assessment: no apparent nausea or vomiting Anesthetic complications: no  No notable events documented.   Last Vitals:  Vitals:   06/25/22 1115 06/25/22 1130  BP: 119/68 (!) 127/58  Pulse: 82 77  Resp: 11 10  Temp:    SpO2: 94% 94%    Last Pain:  Vitals:   06/25/22 1130  TempSrc:   PainSc: 2                  Stephanie Coup

## 2022-06-25 NOTE — Anesthesia Preprocedure Evaluation (Addendum)
Anesthesia Evaluation  Patient identified by MRN, date of birth, ID band Patient awake    Reviewed: Allergy & Precautions, NPO status , Patient's Chart, lab work & pertinent test results  History of Anesthesia Complications Negative for: history of anesthetic complications  Airway Mallampati: II  TM Distance: >3 FB Neck ROM: full    Dental  (+) Dental Advidsory Given   Pulmonary neg pulmonary ROS   Pulmonary exam normal        Cardiovascular hypertension, Pt. on medications negative cardio ROS Normal cardiovascular exam     Neuro/Psych  PSYCHIATRIC DISORDERS      negative neurological ROS  negative psych ROS   GI/Hepatic Neg liver ROS,GERD  ,,  Endo/Other  diabetes    Renal/GU negative Renal ROS     Musculoskeletal   Abdominal   Peds  Hematology negative hematology ROS (+)   Anesthesia Other Findings Past Medical History: No date: Depression No date: Diabetes mellitus without complication (HCC) No date: GERD (gastroesophageal reflux disease)     Comment:  TUMS PRN No date: Hirsutism No date: HLD (hyperlipidemia) No date: Hypertension No date: Lower leg edema  Past Surgical History: 11/26/2015: BREAST BIOPSY; Right     Comment:  atypical lobular hyperplasia-lumpectomy 01/27/2016 05/11/2022: BREAST BIOPSY; Left     Comment:  Left Breast Stereo Bx,  Ribbon Clip path pending 05/11/2022: BREAST BIOPSY; Left     Comment:  Left Breast Stereo Bx #1- Coil clip path pending 05/11/2022: BREAST BIOPSY; Right     Comment:  Right Breast Stereo Bx- X Clip path pending 05/11/2022: BREAST BIOPSY; Right     Comment:  MM RT BREAST BX W LOC DEV 1ST LESION IMAGE BX SPEC               STEREO GUIDE 05/11/2022 [facility] 05/11/2022: BREAST BIOPSY; Left     Comment:  MM LT BREAST BX W LOC DEV 1ST LESION IMAGE BX SPEC               STEREO GUIDE 05/11/2022 [facility] 05/11/2022: BREAST BIOPSY; Left     Comment:  MM LT  BREAST BX W LOC DEV EA AD LESION IMG BX SPEC               STEREO GUIDE 05/11/2022 [facility] 06/22/2022: BREAST BIOPSY; Right     Comment:  MM RT RADIO FREQUENCY TAG LOC MAMMO GUIDE 06/22/2022               [facility] 01/27/2016: BREAST EXCISIONAL BIOPSY; Right     Comment:  ALH excisied, - MARGINS ARE NEGATIVE FOR ATYPIA.  01/27/2016: BREAST LUMPECTOMY WITH NEEDLE LOCALIZATION; Right     Comment:  Procedure: BREAST LUMPECTOMY WITH NEEDLE LOCALIZATION;                Surgeon: Nadeen Landau, MD;  Location: [facility];                Service: General;  Laterality: Right;  BMI    Body Mass Index: 35.19 kg/m      Reproductive/Obstetrics negative OB ROS                             Anesthesia Physical Anesthesia Plan  ASA: 2  Anesthesia Plan: General   Post-op Pain Management:    Induction: Intravenous  PONV Risk Score and Plan: 3 and Ondansetron, Dexamethasone and Midazolam  Airway Management Planned: LMA  Additional Equipment:   Intra-op  Plan:   Post-operative Plan: Extubation in OR  Informed Consent: I have reviewed the patients History and Physical, chart, labs and discussed the procedure including the risks, benefits and alternatives for the proposed anesthesia with the patient or authorized representative who has indicated his/her understanding and acceptance.     Dental Advisory Given  Plan Discussed with: Anesthesiologist, CRNA and Surgeon  Anesthesia Plan Comments: (Patient consented for risks of anesthesia including but not limited to:  - adverse reactions to medications - damage to eyes, teeth, lips or other oral mucosa - nerve damage due to positioning  - sore throat or hoarseness - Damage to heart, brain, nerves, lungs, other parts of body or loss of life  Patient voiced understanding.)        Anesthesia Quick Evaluation

## 2022-06-25 NOTE — Discharge Instructions (Addendum)
  Diet: Resume home heart healthy regular diet.   Activity: No heavy lifting >20 pounds (children, pets, laundry, garbage) or strenuous activity until follow-up, but light activity and walking are encouraged. Do not drive or drink alcohol if taking narcotic pain medications.  Wound care: May shower with soapy water and pat dry (do not rub incisions), but no baths or submerging incision underwater until follow-up. (no swimming)   Medications: Resume all home medications. For mild to moderate pain: acetaminophen (Tylenol) ***or ibuprofen (if no kidney disease). Combining Tylenol with alcohol can substantially increase your risk of causing liver disease. Narcotic pain medications, if prescribed, can be used for severe pain, though may cause nausea, constipation, and drowsiness. Do not combine Tylenol and Norco within a 6 hour period as Norco contains Tylenol. If you do not need the narcotic pain medication, you do not need to fill the prescription.  Call office ([phone]) at any time if any questions, worsening pain, fevers/chills, bleeding, drainage from incision site, or other concerns.   AMBULATORY SURGERY  DISCHARGE INSTRUCTIONS   The drugs that you were given will stay in your system until tomorrow so for the next 24 hours you should not:  Drive an automobile Make any legal decisions Drink any alcoholic beverage   You may resume regular meals tomorrow.  Today it is better to start with liquids and gradually work up to solid foods.  You may eat anything you prefer, but it is better to start with liquids, then soup and crackers, and gradually work up to solid foods.   Please notify your doctor immediately if you have any unusual bleeding, trouble breathing, redness and pain at the surgery site, drainage, fever, or pain not relieved by medication.    Additional Instructions:        Please contact your physician with any problems or Same Day Surgery at [phone], Monday  through Friday 6 am to 4 pm, or [place] [facility] [place] Main number at [phone].  

## 2022-06-28 LAB — SURGICAL PATHOLOGY

## 2022-10-20 ENCOUNTER — Other Ambulatory Visit: Payer: Self-pay

## 2022-10-20 ENCOUNTER — Encounter: Payer: Self-pay | Admitting: Emergency Medicine

## 2022-10-20 ENCOUNTER — Emergency Department
Admission: EM | Admit: 2022-10-20 | Discharge: 2022-10-21 | Disposition: A | Discharge status: Home / Self Care | Payer: Managed Care, Other (non HMO) | Attending: Emergency Medicine | Admitting: Emergency Medicine

## 2022-10-20 DIAGNOSIS — R519 Headache, unspecified: Secondary | ICD-10-CM | POA: Insufficient documentation

## 2022-10-20 LAB — CBC WITH DIFFERENTIAL/PLATELET
Abs Immature Granulocytes: 0.12 10*3/uL — ABNORMAL HIGH (ref 0.00–0.07)
Basophils Absolute: 0.1 10*3/uL (ref 0.0–0.1)
Basophils Relative: 0 %
Eosinophils Absolute: 0.1 10*3/uL (ref 0.0–0.5)
Eosinophils Relative: 1 %
HCT: 44.5 % (ref 36.0–46.0)
Hemoglobin: 14.8 g/dL (ref 12.0–15.0)
Immature Granulocytes: 1 %
Lymphocytes Relative: 23 %
Lymphs Abs: 2.9 10*3/uL (ref 0.7–4.0)
MCH: 27.2 pg (ref 26.0–34.0)
MCHC: 33.3 g/dL (ref 30.0–36.0)
MCV: 81.8 fL (ref 80.0–100.0)
Monocytes Absolute: 0.7 10*3/uL (ref 0.1–1.0)
Monocytes Relative: 6 %
Neutro Abs: 8.5 10*3/uL — ABNORMAL HIGH (ref 1.7–7.7)
Neutrophils Relative %: 69 %
Platelets: 377 10*3/uL (ref 150–400)
RBC: 5.44 MIL/uL — ABNORMAL HIGH (ref 3.87–5.11)
RDW: 13.1 % (ref 11.5–15.5)
WBC: 12.4 10*3/uL — ABNORMAL HIGH (ref 4.0–10.5)
nRBC: 0 % (ref 0.0–0.2)

## 2022-10-20 LAB — COMPREHENSIVE METABOLIC PANEL
ALT: 26 U/L (ref 0–44)
AST: 26 U/L (ref 15–41)
Albumin: 3.9 g/dL (ref 3.5–5.0)
Alkaline Phosphatase: 104 U/L (ref 38–126)
Anion gap: 15 (ref 5–15)
BUN: 18 mg/dL (ref 6–20)
CO2: 18 mmol/L — ABNORMAL LOW (ref 22–32)
Calcium: 9.2 mg/dL (ref 8.9–10.3)
Chloride: 100 mmol/L (ref 98–111)
Creatinine, Ser: 0.58 mg/dL (ref 0.44–1.00)
GFR, Estimated: >60 mL/min (ref >60)
Glucose, Bld: 223 mg/dL — ABNORMAL HIGH (ref 70–99)
Potassium: 3.2 mmol/L — ABNORMAL LOW (ref 3.5–5.1)
Sodium: 133 mmol/L — ABNORMAL LOW (ref 135–145)
Total Bilirubin: 0.8 mg/dL (ref 0.3–1.2)
Total Protein: 7.8 g/dL (ref 6.5–8.1)

## 2022-10-20 MED ORDER — PROCHLORPERAZINE EDISYLATE 10 MG/2ML IJ SOLN
10.0000 mg | Freq: Once | INTRAMUSCULAR | Status: AC
  Administered 2022-10-20: 10 mg via INTRAVENOUS
  Filled 2022-10-20: qty 2

## 2022-10-20 MED ORDER — KETOROLAC TROMETHAMINE 30 MG/ML IJ SOLN
15.0000 mg | Freq: Once | INTRAMUSCULAR | Status: AC
  Administered 2022-10-20: 15 mg via INTRAVENOUS
  Filled 2022-10-20: qty 1

## 2022-10-20 MED ORDER — SODIUM CHLORIDE 0.9 % IV BOLUS
1000.0000 mL | Freq: Once | INTRAVENOUS | Status: AC
  Administered 2022-10-20: 1000 mL via INTRAVENOUS

## 2022-10-20 MED ORDER — DIPHENHYDRAMINE HCL 50 MG/ML IJ SOLN
25.0000 mg | Freq: Once | INTRAMUSCULAR | Status: AC
  Administered 2022-10-20: 25 mg via INTRAVENOUS
  Filled 2022-10-20: qty 1

## 2022-10-20 NOTE — ED Triage Notes (Addendum)
Pt presents ambulatory to triage via POV with complaints of generalized headache x 6 hours with light sensitivity. Pt notes taking Tylenol PTA without any improvement. Rates the pain 10/10. Hx of migraine headaches similar to this one. A&Ox4 at this time. Denies CP or SOB.    Pts Spouse had told the registration team that the patient was having CP; Pt states she doesn't have CP but her spouse said that so she could be taken back faster.

## 2022-10-20 NOTE — ED Provider Notes (Signed)
[facility] Provider Note    Event Date/Time   First MD Initiated Contact with Patient 10/20/22 2033     (approximate)   History   Headache   HPI  Monica Mack is a 57 y.o. female presenting to the emergency department for evaluation of headache.  Patient reports that earlier today she had onset of a generalized headache with associated light sensitivity.  Not sudden in onset or different than prior headaches.  Took Tylenol and ibuprofen without significant benefit.  No new numbness, tingling, weakness.  No chest pain or shortness of breath.     Physical Exam   Triage Vital Signs: ED Triage Vitals  Encounter Vitals Group     BP 10/20/22 1929 (!) 172/68     Systolic BP Percentile --      Diastolic BP Percentile --      Pulse Rate 10/20/22 1929 100     Resp 10/20/22 1929 18     Temp 10/20/22 1929 98.5 F (36.9 C)     Temp Source 10/20/22 1929 Oral     SpO2 10/20/22 1929 100 %     Weight 10/20/22 1929 210 lb (95.3 kg)     Height 10/20/22 1929 5\' 4"  (1.626 m)     Head Circumference --      Peak Flow --      Pain Score 10/20/22 1928 10     Pain Loc --      Pain Education --      Exclude from Growth Chart --     Most recent vital signs: Vitals:   10/20/22 1929  BP: (!) 172/68  Pulse: 100  Resp: 18  Temp: 98.5 F (36.9 C)  SpO2: 100%     General: Awake, interactive  Head:   Atraumatic, light sensitivity noted CV:  Regular rate, good peripheral perfusion.  Resp:  Lungs clear, unlabored respirations.  Abd:  Soft, nondistended.  Neuro:  Alert and oriented, normal extraocular movements, symmetric facial movement, sensation intact over bilateral upper and lower extremities with 5 out of 5 strength.  Normal finger-to-nose testing.  ED Results / Procedures / Treatments   Labs (all labs ordered are listed, but only abnormal results are displayed) Labs Reviewed  CBC WITH DIFFERENTIAL/PLATELET - Abnormal; Notable for the following  components:      Result Value   WBC 12.4 (*)    RBC 5.44 (*)    Neutro Abs 8.5 (*)    Abs Immature Granulocytes 0.12 (*)    All other components within normal limits  COMPREHENSIVE METABOLIC PANEL - Abnormal; Notable for the following components:   Sodium 133 (*)    Potassium 3.2 (*)    CO2 18 (*)    Glucose, Bld 223 (*)    All other components within normal limits     EKG EKG independently reviewed interpreted by myself (ER attending) demonstrates:    RADIOLOGY Imaging independently reviewed and interpreted by myself demonstrates:    PROCEDURES:  Critical Care performed: No  Procedures   MEDICATIONS ORDERED IN ED: Medications  sodium chloride 0.9 % bolus 1,000 mL (1,000 mLs Intravenous New Bag/Given 10/20/22 2110)  ketorolac (TORADOL) 30 MG/ML injection 15 mg (15 mg Intravenous Given 10/20/22 2109)  diphenhydrAMINE (BENADRYL) injection 25 mg (25 mg Intravenous Given 10/20/22 2110)  prochlorperazine (COMPAZINE) injection 10 mg (10 mg Intravenous Given 10/20/22 2110)     IMPRESSION / MDM / ASSESSMENT AND PLAN / ED COURSE  I reviewed the triage vital  signs and the nursing notes.  Differential diagnosis includes, but is not limited to, migraine headache, other benign headache, overall lower suspicion for significant intracranial process in the absence of focal neurologic deficits, fever, similar headaches previously.  Patient's presentation is most consistent with acute illness / injury with system symptoms.  57 year old female presenting with headache without focal neurologic deficits.  Labs sent from triage without severe derangements.  Patient was treated symptomatically with a headache cocktail.  She does report significant improvement on reevaluation.  She is comfortable with discharge and outpatient follow-up.  Strict return precautions provided.  Patient discharged stable condition.      FINAL CLINICAL IMPRESSION(S) / ED DIAGNOSES   Final diagnoses:  Acute  nonintractable headache, unspecified headache type     Rx / DC Orders   ED Discharge Orders     None        Note:  This document was prepared using Dragon voice recognition software and may include unintentional dictation errors.   Trinna Post, MD 10/20/22 [phone]

## 2022-10-20 NOTE — Discharge Instructions (Signed)
You were seen in the ER today for evaluation of your headache. Your exam here was fortunately reassuring. Please arrange follow-up with a primary care doctor within the next few days for reevaluation if your symptoms have not improved. Return to the ER if you develop new or worsening headache, fever, neck stiffness, changes in vision, difficulty walking, weakness, dizziness, confusion, vomiting, numbness, tingling, or any other new or concerning symptoms that you believe warrants immediate attention.

## 2022-12-02 ENCOUNTER — Ambulatory Visit: Payer: Managed Care, Other (non HMO)

## 2022-12-02 DIAGNOSIS — D122 Benign neoplasm of ascending colon: Secondary | ICD-10-CM | POA: Diagnosis not present

## 2022-12-02 DIAGNOSIS — Z860101 Personal history of adenomatous and serrated colon polyps: Secondary | ICD-10-CM | POA: Diagnosis not present

## 2022-12-02 DIAGNOSIS — K640 First degree hemorrhoids: Secondary | ICD-10-CM | POA: Diagnosis not present

## 2022-12-02 DIAGNOSIS — Z09 Encounter for follow-up examination after completed treatment for conditions other than malignant neoplasm: Secondary | ICD-10-CM | POA: Diagnosis present

## 2022-12-02 DIAGNOSIS — K635 Polyp of colon: Secondary | ICD-10-CM | POA: Diagnosis not present

## 2024-03-02 ENCOUNTER — Other Ambulatory Visit: Payer: Self-pay | Admitting: Internal Medicine

## 2024-03-02 DIAGNOSIS — Z1231 Encounter for screening mammogram for malignant neoplasm of breast: Secondary | ICD-10-CM
